# Patient Record
Sex: Female | Born: 1945
Health system: Southern US, Community
[De-identification: ages and names within clinical notes are randomized; demographics above are authoritative.]

## PROBLEM LIST (undated history)

## (undated) DIAGNOSIS — K8591 Acute pancreatitis with uninfected necrosis, unspecified: Secondary | ICD-10-CM

## (undated) DIAGNOSIS — E079 Disorder of thyroid, unspecified: Secondary | ICD-10-CM

## (undated) DIAGNOSIS — Z86718 Personal history of other venous thrombosis and embolism: Secondary | ICD-10-CM

## (undated) DIAGNOSIS — E119 Type 2 diabetes mellitus without complications: Secondary | ICD-10-CM

## (undated) DIAGNOSIS — I10 Essential (primary) hypertension: Secondary | ICD-10-CM

## (undated) DIAGNOSIS — Z5189 Encounter for other specified aftercare: Secondary | ICD-10-CM

## (undated) HISTORY — DX: Encounter for other specified aftercare: Z51.89

## (undated) HISTORY — DX: Type 2 diabetes mellitus without complications: E11.9

## (undated) HISTORY — DX: Personal history of other venous thrombosis and embolism: Z86.718

## (undated) HISTORY — DX: Acute pancreatitis with uninfected necrosis, unspecified: K85.91

## (undated) HISTORY — DX: Essential (primary) hypertension: I10

## (undated) HISTORY — DX: Disorder of thyroid, unspecified: E07.9

---

## 2005-03-22 HISTORY — PX: APPENDECTOMY: SHX54

## 2012-03-22 DIAGNOSIS — Z86718 Personal history of other venous thrombosis and embolism: Secondary | ICD-10-CM

## 2012-03-22 DIAGNOSIS — K8591 Acute pancreatitis with uninfected necrosis, unspecified: Secondary | ICD-10-CM

## 2012-03-22 HISTORY — DX: Acute pancreatitis with uninfected necrosis, unspecified: K85.91

## 2012-03-22 HISTORY — DX: Personal history of other venous thrombosis and embolism: Z86.718

## 2013-02-22 DIAGNOSIS — E039 Hypothyroidism, unspecified: Secondary | ICD-10-CM | POA: Insufficient documentation

## 2013-06-09 DIAGNOSIS — K8689 Other specified diseases of pancreas: Secondary | ICD-10-CM | POA: Insufficient documentation

## 2013-06-20 HISTORY — PX: PANCREAS SURGERY: SHX731

## 2013-06-28 DIAGNOSIS — E43 Unspecified severe protein-calorie malnutrition: Secondary | ICD-10-CM | POA: Insufficient documentation

## 2013-06-30 DIAGNOSIS — K6819 Other retroperitoneal abscess: Secondary | ICD-10-CM | POA: Insufficient documentation

## 2013-07-03 DIAGNOSIS — Z86711 Personal history of pulmonary embolism: Secondary | ICD-10-CM | POA: Insufficient documentation

## 2013-10-08 DIAGNOSIS — F411 Generalized anxiety disorder: Secondary | ICD-10-CM | POA: Insufficient documentation

## 2013-10-08 DIAGNOSIS — E119 Type 2 diabetes mellitus without complications: Secondary | ICD-10-CM | POA: Insufficient documentation

## 2013-10-08 DIAGNOSIS — D649 Anemia, unspecified: Secondary | ICD-10-CM | POA: Insufficient documentation

## 2013-10-08 DIAGNOSIS — L89309 Pressure ulcer of unspecified buttock, unspecified stage: Secondary | ICD-10-CM | POA: Insufficient documentation

## 2014-01-07 DIAGNOSIS — R600 Localized edema: Secondary | ICD-10-CM | POA: Insufficient documentation

## 2014-01-23 DIAGNOSIS — I899 Noninfective disorder of lymphatic vessels and lymph nodes, unspecified: Secondary | ICD-10-CM | POA: Insufficient documentation

## 2014-05-08 DIAGNOSIS — K8681 Exocrine pancreatic insufficiency: Secondary | ICD-10-CM | POA: Insufficient documentation

## 2014-08-07 DIAGNOSIS — K859 Acute pancreatitis without necrosis or infection, unspecified: Secondary | ICD-10-CM | POA: Insufficient documentation

## 2014-11-01 ENCOUNTER — Other Ambulatory Visit: Payer: Self-pay | Admitting: Family Medicine

## 2014-11-01 DIAGNOSIS — E2839 Other primary ovarian failure: Secondary | ICD-10-CM

## 2014-11-01 DIAGNOSIS — Z1231 Encounter for screening mammogram for malignant neoplasm of breast: Secondary | ICD-10-CM

## 2014-11-07 ENCOUNTER — Encounter: Payer: Self-pay | Admitting: Gastroenterology

## 2014-11-11 ENCOUNTER — Ambulatory Visit
Admission: RE | Admit: 2014-11-11 | Discharge: 2014-11-11 | Disposition: A | Discharge status: Home / Self Care | Payer: Medicare PPO | Source: Ambulatory Visit | Attending: Family Medicine | Admitting: Family Medicine

## 2014-11-11 DIAGNOSIS — Z1231 Encounter for screening mammogram for malignant neoplasm of breast: Secondary | ICD-10-CM

## 2014-11-11 DIAGNOSIS — E2839 Other primary ovarian failure: Secondary | ICD-10-CM

## 2014-11-22 DIAGNOSIS — R197 Diarrhea, unspecified: Secondary | ICD-10-CM | POA: Insufficient documentation

## 2014-11-22 DIAGNOSIS — M8580 Other specified disorders of bone density and structure, unspecified site: Secondary | ICD-10-CM | POA: Insufficient documentation

## 2015-01-14 ENCOUNTER — Encounter: Payer: Self-pay | Admitting: Gastroenterology

## 2015-03-03 ENCOUNTER — Encounter: Payer: Medicare PPO | Admitting: Gastroenterology

## 2015-05-12 DIAGNOSIS — E119 Type 2 diabetes mellitus without complications: Secondary | ICD-10-CM | POA: Diagnosis not present

## 2015-05-12 DIAGNOSIS — K8689 Other specified diseases of pancreas: Secondary | ICD-10-CM | POA: Diagnosis not present

## 2015-05-12 DIAGNOSIS — D638 Anemia in other chronic diseases classified elsewhere: Secondary | ICD-10-CM | POA: Diagnosis not present

## 2015-05-12 DIAGNOSIS — K8591 Acute pancreatitis with uninfected necrosis, unspecified: Secondary | ICD-10-CM | POA: Diagnosis not present

## 2015-05-12 DIAGNOSIS — K6819 Other retroperitoneal abscess: Secondary | ICD-10-CM | POA: Diagnosis not present

## 2015-05-12 DIAGNOSIS — E039 Hypothyroidism, unspecified: Secondary | ICD-10-CM | POA: Diagnosis not present

## 2015-05-16 DIAGNOSIS — Z794 Long term (current) use of insulin: Secondary | ICD-10-CM | POA: Diagnosis not present

## 2015-05-16 DIAGNOSIS — E119 Type 2 diabetes mellitus without complications: Secondary | ICD-10-CM | POA: Diagnosis not present

## 2015-05-16 DIAGNOSIS — E663 Overweight: Secondary | ICD-10-CM | POA: Diagnosis not present

## 2015-05-16 DIAGNOSIS — Z6829 Body mass index (BMI) 29.0-29.9, adult: Secondary | ICD-10-CM | POA: Diagnosis not present

## 2015-05-16 DIAGNOSIS — E039 Hypothyroidism, unspecified: Secondary | ICD-10-CM | POA: Diagnosis not present

## 2015-05-26 DIAGNOSIS — E039 Hypothyroidism, unspecified: Secondary | ICD-10-CM | POA: Diagnosis not present

## 2015-05-26 DIAGNOSIS — E559 Vitamin D deficiency, unspecified: Secondary | ICD-10-CM | POA: Diagnosis not present

## 2015-05-26 DIAGNOSIS — Z0000 Encounter for general adult medical examination without abnormal findings: Secondary | ICD-10-CM | POA: Diagnosis not present

## 2015-05-28 DIAGNOSIS — E119 Type 2 diabetes mellitus without complications: Secondary | ICD-10-CM | POA: Diagnosis not present

## 2015-05-28 DIAGNOSIS — E118 Type 2 diabetes mellitus with unspecified complications: Secondary | ICD-10-CM | POA: Diagnosis not present

## 2015-05-28 DIAGNOSIS — E039 Hypothyroidism, unspecified: Secondary | ICD-10-CM | POA: Diagnosis not present

## 2015-05-28 DIAGNOSIS — E559 Vitamin D deficiency, unspecified: Secondary | ICD-10-CM | POA: Diagnosis not present

## 2015-06-25 DIAGNOSIS — Z794 Long term (current) use of insulin: Secondary | ICD-10-CM | POA: Diagnosis not present

## 2015-06-25 DIAGNOSIS — E119 Type 2 diabetes mellitus without complications: Secondary | ICD-10-CM | POA: Diagnosis not present

## 2015-06-25 DIAGNOSIS — H2513 Age-related nuclear cataract, bilateral: Secondary | ICD-10-CM | POA: Diagnosis not present

## 2015-06-27 DIAGNOSIS — E119 Type 2 diabetes mellitus without complications: Secondary | ICD-10-CM | POA: Diagnosis not present

## 2015-08-28 DIAGNOSIS — E039 Hypothyroidism, unspecified: Secondary | ICD-10-CM | POA: Diagnosis not present

## 2015-08-28 DIAGNOSIS — E119 Type 2 diabetes mellitus without complications: Secondary | ICD-10-CM | POA: Diagnosis not present

## 2015-08-29 DIAGNOSIS — E039 Hypothyroidism, unspecified: Secondary | ICD-10-CM | POA: Diagnosis not present

## 2015-08-29 DIAGNOSIS — D649 Anemia, unspecified: Secondary | ICD-10-CM | POA: Diagnosis not present

## 2015-08-29 DIAGNOSIS — E118 Type 2 diabetes mellitus with unspecified complications: Secondary | ICD-10-CM | POA: Diagnosis not present

## 2015-11-13 ENCOUNTER — Other Ambulatory Visit: Payer: Self-pay | Admitting: Family Medicine

## 2015-11-13 DIAGNOSIS — Z1231 Encounter for screening mammogram for malignant neoplasm of breast: Secondary | ICD-10-CM

## 2015-11-18 ENCOUNTER — Ambulatory Visit
Admission: RE | Admit: 2015-11-18 | Discharge: 2015-11-18 | Disposition: A | Discharge status: Home / Self Care | Payer: Medicare PPO | Source: Ambulatory Visit | Attending: Family Medicine | Admitting: Family Medicine

## 2015-11-18 DIAGNOSIS — Z1231 Encounter for screening mammogram for malignant neoplasm of breast: Secondary | ICD-10-CM

## 2015-12-09 DIAGNOSIS — D485 Neoplasm of uncertain behavior of skin: Secondary | ICD-10-CM | POA: Diagnosis not present

## 2015-12-09 DIAGNOSIS — D1801 Hemangioma of skin and subcutaneous tissue: Secondary | ICD-10-CM | POA: Diagnosis not present

## 2015-12-09 DIAGNOSIS — L821 Other seborrheic keratosis: Secondary | ICD-10-CM | POA: Diagnosis not present

## 2015-12-09 DIAGNOSIS — D234 Other benign neoplasm of skin of scalp and neck: Secondary | ICD-10-CM | POA: Diagnosis not present

## 2015-12-09 DIAGNOSIS — Z08 Encounter for follow-up examination after completed treatment for malignant neoplasm: Secondary | ICD-10-CM | POA: Diagnosis not present

## 2015-12-09 DIAGNOSIS — Z85828 Personal history of other malignant neoplasm of skin: Secondary | ICD-10-CM | POA: Diagnosis not present

## 2015-12-09 DIAGNOSIS — Z872 Personal history of diseases of the skin and subcutaneous tissue: Secondary | ICD-10-CM | POA: Diagnosis not present

## 2015-12-10 DIAGNOSIS — E119 Type 2 diabetes mellitus without complications: Secondary | ICD-10-CM | POA: Diagnosis not present

## 2015-12-10 DIAGNOSIS — E118 Type 2 diabetes mellitus with unspecified complications: Secondary | ICD-10-CM | POA: Diagnosis not present

## 2015-12-12 DIAGNOSIS — F411 Generalized anxiety disorder: Secondary | ICD-10-CM | POA: Diagnosis not present

## 2015-12-12 DIAGNOSIS — E039 Hypothyroidism, unspecified: Secondary | ICD-10-CM | POA: Diagnosis not present

## 2015-12-12 DIAGNOSIS — Z23 Encounter for immunization: Secondary | ICD-10-CM | POA: Diagnosis not present

## 2015-12-12 DIAGNOSIS — E119 Type 2 diabetes mellitus without complications: Secondary | ICD-10-CM | POA: Diagnosis not present

## 2015-12-12 DIAGNOSIS — E559 Vitamin D deficiency, unspecified: Secondary | ICD-10-CM | POA: Diagnosis not present

## 2016-03-17 DIAGNOSIS — E039 Hypothyroidism, unspecified: Secondary | ICD-10-CM | POA: Diagnosis not present

## 2016-03-18 DIAGNOSIS — Z6829 Body mass index (BMI) 29.0-29.9, adult: Secondary | ICD-10-CM | POA: Diagnosis not present

## 2016-03-18 DIAGNOSIS — E118 Type 2 diabetes mellitus with unspecified complications: Secondary | ICD-10-CM | POA: Diagnosis not present

## 2016-03-18 DIAGNOSIS — E119 Type 2 diabetes mellitus without complications: Secondary | ICD-10-CM | POA: Diagnosis not present

## 2016-03-18 DIAGNOSIS — E039 Hypothyroidism, unspecified: Secondary | ICD-10-CM | POA: Diagnosis not present

## 2016-03-18 DIAGNOSIS — Z0000 Encounter for general adult medical examination without abnormal findings: Secondary | ICD-10-CM | POA: Diagnosis not present

## 2016-03-18 DIAGNOSIS — F411 Generalized anxiety disorder: Secondary | ICD-10-CM | POA: Diagnosis not present

## 2016-06-29 DIAGNOSIS — E119 Type 2 diabetes mellitus without complications: Secondary | ICD-10-CM | POA: Diagnosis not present

## 2016-06-29 DIAGNOSIS — E039 Hypothyroidism, unspecified: Secondary | ICD-10-CM | POA: Diagnosis not present

## 2016-07-01 DIAGNOSIS — I10 Essential (primary) hypertension: Secondary | ICD-10-CM | POA: Diagnosis not present

## 2016-07-01 DIAGNOSIS — E039 Hypothyroidism, unspecified: Secondary | ICD-10-CM | POA: Diagnosis not present

## 2016-07-01 DIAGNOSIS — E119 Type 2 diabetes mellitus without complications: Secondary | ICD-10-CM | POA: Diagnosis not present

## 2016-08-12 DIAGNOSIS — L820 Inflamed seborrheic keratosis: Secondary | ICD-10-CM | POA: Diagnosis not present

## 2016-08-12 DIAGNOSIS — D485 Neoplasm of uncertain behavior of skin: Secondary | ICD-10-CM | POA: Diagnosis not present

## 2016-08-12 DIAGNOSIS — L821 Other seborrheic keratosis: Secondary | ICD-10-CM | POA: Diagnosis not present

## 2016-08-30 DIAGNOSIS — E039 Hypothyroidism, unspecified: Secondary | ICD-10-CM | POA: Diagnosis not present

## 2016-09-01 DIAGNOSIS — H2513 Age-related nuclear cataract, bilateral: Secondary | ICD-10-CM | POA: Diagnosis not present

## 2016-09-01 DIAGNOSIS — E083291 Diabetes mellitus due to underlying condition with mild nonproliferative diabetic retinopathy without macular edema, right eye: Secondary | ICD-10-CM | POA: Diagnosis not present

## 2016-09-03 DIAGNOSIS — E039 Hypothyroidism, unspecified: Secondary | ICD-10-CM | POA: Diagnosis not present

## 2016-09-03 DIAGNOSIS — I10 Essential (primary) hypertension: Secondary | ICD-10-CM | POA: Diagnosis not present

## 2016-09-03 DIAGNOSIS — Z6830 Body mass index (BMI) 30.0-30.9, adult: Secondary | ICD-10-CM | POA: Diagnosis not present

## 2016-09-03 DIAGNOSIS — E663 Overweight: Secondary | ICD-10-CM | POA: Diagnosis not present

## 2016-10-20 ENCOUNTER — Other Ambulatory Visit: Payer: Self-pay | Admitting: Family Medicine

## 2016-10-20 DIAGNOSIS — Z1231 Encounter for screening mammogram for malignant neoplasm of breast: Secondary | ICD-10-CM

## 2016-10-29 DIAGNOSIS — E039 Hypothyroidism, unspecified: Secondary | ICD-10-CM | POA: Diagnosis not present

## 2016-10-29 DIAGNOSIS — E559 Vitamin D deficiency, unspecified: Secondary | ICD-10-CM | POA: Diagnosis not present

## 2016-10-29 DIAGNOSIS — M8580 Other specified disorders of bone density and structure, unspecified site: Secondary | ICD-10-CM | POA: Diagnosis not present

## 2016-10-29 DIAGNOSIS — E118 Type 2 diabetes mellitus with unspecified complications: Secondary | ICD-10-CM | POA: Diagnosis not present

## 2016-11-01 DIAGNOSIS — E119 Type 2 diabetes mellitus without complications: Secondary | ICD-10-CM | POA: Diagnosis not present

## 2016-11-01 DIAGNOSIS — E663 Overweight: Secondary | ICD-10-CM | POA: Diagnosis not present

## 2016-11-01 DIAGNOSIS — E559 Vitamin D deficiency, unspecified: Secondary | ICD-10-CM | POA: Diagnosis not present

## 2016-11-01 DIAGNOSIS — E039 Hypothyroidism, unspecified: Secondary | ICD-10-CM | POA: Diagnosis not present

## 2016-11-23 ENCOUNTER — Ambulatory Visit
Admission: RE | Admit: 2016-11-23 | Discharge: 2016-11-23 | Disposition: A | Discharge status: Home / Self Care | Payer: Medicare PPO | Source: Ambulatory Visit | Attending: Family Medicine | Admitting: Family Medicine

## 2016-11-23 DIAGNOSIS — Z1231 Encounter for screening mammogram for malignant neoplasm of breast: Secondary | ICD-10-CM

## 2016-12-02 DIAGNOSIS — Z23 Encounter for immunization: Secondary | ICD-10-CM | POA: Diagnosis not present

## 2016-12-07 DIAGNOSIS — B078 Other viral warts: Secondary | ICD-10-CM | POA: Diagnosis not present

## 2016-12-07 DIAGNOSIS — D1801 Hemangioma of skin and subcutaneous tissue: Secondary | ICD-10-CM | POA: Diagnosis not present

## 2016-12-07 DIAGNOSIS — Z85828 Personal history of other malignant neoplasm of skin: Secondary | ICD-10-CM | POA: Diagnosis not present

## 2016-12-07 DIAGNOSIS — D485 Neoplasm of uncertain behavior of skin: Secondary | ICD-10-CM | POA: Diagnosis not present

## 2016-12-07 DIAGNOSIS — L821 Other seborrheic keratosis: Secondary | ICD-10-CM | POA: Diagnosis not present

## 2016-12-07 DIAGNOSIS — Z08 Encounter for follow-up examination after completed treatment for malignant neoplasm: Secondary | ICD-10-CM | POA: Diagnosis not present

## 2016-12-24 DIAGNOSIS — Z6829 Body mass index (BMI) 29.0-29.9, adult: Secondary | ICD-10-CM | POA: Diagnosis not present

## 2016-12-24 DIAGNOSIS — I10 Essential (primary) hypertension: Secondary | ICD-10-CM | POA: Diagnosis not present

## 2016-12-24 DIAGNOSIS — F5104 Psychophysiologic insomnia: Secondary | ICD-10-CM | POA: Diagnosis not present

## 2016-12-24 DIAGNOSIS — E119 Type 2 diabetes mellitus without complications: Secondary | ICD-10-CM | POA: Diagnosis not present

## 2017-01-28 DIAGNOSIS — E119 Type 2 diabetes mellitus without complications: Secondary | ICD-10-CM | POA: Diagnosis not present

## 2017-01-31 DIAGNOSIS — I10 Essential (primary) hypertension: Secondary | ICD-10-CM | POA: Diagnosis not present

## 2017-01-31 DIAGNOSIS — E119 Type 2 diabetes mellitus without complications: Secondary | ICD-10-CM | POA: Diagnosis not present

## 2017-01-31 DIAGNOSIS — G4700 Insomnia, unspecified: Secondary | ICD-10-CM | POA: Diagnosis not present

## 2017-01-31 DIAGNOSIS — Z6829 Body mass index (BMI) 29.0-29.9, adult: Secondary | ICD-10-CM | POA: Diagnosis not present

## 2017-04-15 DIAGNOSIS — Z0000 Encounter for general adult medical examination without abnormal findings: Secondary | ICD-10-CM | POA: Diagnosis not present

## 2017-04-15 DIAGNOSIS — Z23 Encounter for immunization: Secondary | ICD-10-CM | POA: Diagnosis not present

## 2017-04-15 DIAGNOSIS — E118 Type 2 diabetes mellitus with unspecified complications: Secondary | ICD-10-CM | POA: Diagnosis not present

## 2017-04-15 DIAGNOSIS — Z1211 Encounter for screening for malignant neoplasm of colon: Secondary | ICD-10-CM | POA: Diagnosis not present

## 2017-04-15 DIAGNOSIS — Z6829 Body mass index (BMI) 29.0-29.9, adult: Secondary | ICD-10-CM | POA: Diagnosis not present

## 2017-04-19 ENCOUNTER — Encounter: Payer: Self-pay | Admitting: Gastroenterology

## 2017-05-31 DIAGNOSIS — Z6829 Body mass index (BMI) 29.0-29.9, adult: Secondary | ICD-10-CM | POA: Diagnosis not present

## 2017-05-31 DIAGNOSIS — E119 Type 2 diabetes mellitus without complications: Secondary | ICD-10-CM | POA: Diagnosis not present

## 2017-05-31 DIAGNOSIS — Z23 Encounter for immunization: Secondary | ICD-10-CM | POA: Diagnosis not present

## 2017-05-31 DIAGNOSIS — I10 Essential (primary) hypertension: Secondary | ICD-10-CM | POA: Diagnosis not present

## 2017-06-14 ENCOUNTER — Encounter: Payer: Medicare PPO | Admitting: Gastroenterology

## 2017-07-04 DIAGNOSIS — E118 Type 2 diabetes mellitus with unspecified complications: Secondary | ICD-10-CM | POA: Diagnosis not present

## 2017-07-04 DIAGNOSIS — E663 Overweight: Secondary | ICD-10-CM | POA: Diagnosis not present

## 2017-07-04 DIAGNOSIS — Z6829 Body mass index (BMI) 29.0-29.9, adult: Secondary | ICD-10-CM | POA: Diagnosis not present

## 2017-07-04 DIAGNOSIS — I10 Essential (primary) hypertension: Secondary | ICD-10-CM | POA: Diagnosis not present

## 2017-07-11 ENCOUNTER — Encounter: Payer: Medicare PPO | Admitting: Gastroenterology

## 2017-07-27 ENCOUNTER — Other Ambulatory Visit: Payer: Self-pay

## 2017-07-27 ENCOUNTER — Ambulatory Visit (AMBULATORY_SURGERY_CENTER): Payer: Self-pay | Admitting: *Deleted

## 2017-07-27 VITALS — Ht 59.75 in | Wt 145.0 lb

## 2017-07-27 DIAGNOSIS — E119 Type 2 diabetes mellitus without complications: Secondary | ICD-10-CM | POA: Insufficient documentation

## 2017-07-27 DIAGNOSIS — Z1211 Encounter for screening for malignant neoplasm of colon: Secondary | ICD-10-CM

## 2017-07-27 DIAGNOSIS — Z136 Encounter for screening for cardiovascular disorders: Secondary | ICD-10-CM | POA: Insufficient documentation

## 2017-07-27 MED ORDER — NA SULFATE-K SULFATE-MG SULF 17.5-3.13-1.6 GM/177ML PO SOLN: ORAL | 0 refills | Status: DC

## 2017-07-27 NOTE — Progress Notes (Signed)
Daughter present in [place] today. Patient denies any allergies to eggs or soy. Patient denies any problems with anesthesia/sedation. Patient denies any oxygen use at home. Patient denies taking any diet/weight loss medications or blood thinners. EMMI education assisgned to patient on colonoscopy, this was explained and instructions given to patient.

## 2017-08-01 ENCOUNTER — Telehealth: Payer: Self-pay | Admitting: Gastroenterology

## 2017-08-01 NOTE — Telephone Encounter (Signed)
Okay she can reschedule at her convenience.

## 2017-08-03 ENCOUNTER — Encounter: Payer: Medicare PPO | Admitting: Gastroenterology

## 2017-08-08 DIAGNOSIS — E039 Hypothyroidism, unspecified: Secondary | ICD-10-CM | POA: Diagnosis not present

## 2017-08-08 DIAGNOSIS — E119 Type 2 diabetes mellitus without complications: Secondary | ICD-10-CM | POA: Diagnosis not present

## 2017-08-08 DIAGNOSIS — I10 Essential (primary) hypertension: Secondary | ICD-10-CM | POA: Diagnosis not present

## 2017-08-08 DIAGNOSIS — Z6829 Body mass index (BMI) 29.0-29.9, adult: Secondary | ICD-10-CM | POA: Diagnosis not present

## 2017-08-10 DIAGNOSIS — E083291 Diabetes mellitus due to underlying condition with mild nonproliferative diabetic retinopathy without macular edema, right eye: Secondary | ICD-10-CM | POA: Diagnosis not present

## 2017-08-10 DIAGNOSIS — H2513 Age-related nuclear cataract, bilateral: Secondary | ICD-10-CM | POA: Diagnosis not present

## 2017-08-10 DIAGNOSIS — E1339 Other specified diabetes mellitus with other diabetic ophthalmic complication: Secondary | ICD-10-CM | POA: Diagnosis not present

## 2017-09-08 DIAGNOSIS — E039 Hypothyroidism, unspecified: Secondary | ICD-10-CM | POA: Diagnosis not present

## 2017-09-08 DIAGNOSIS — E559 Vitamin D deficiency, unspecified: Secondary | ICD-10-CM | POA: Diagnosis not present

## 2017-09-08 DIAGNOSIS — E119 Type 2 diabetes mellitus without complications: Secondary | ICD-10-CM | POA: Diagnosis not present

## 2017-09-12 DIAGNOSIS — E559 Vitamin D deficiency, unspecified: Secondary | ICD-10-CM | POA: Diagnosis not present

## 2017-09-12 DIAGNOSIS — E039 Hypothyroidism, unspecified: Secondary | ICD-10-CM | POA: Diagnosis not present

## 2017-09-12 DIAGNOSIS — I10 Essential (primary) hypertension: Secondary | ICD-10-CM | POA: Diagnosis not present

## 2017-09-12 DIAGNOSIS — E119 Type 2 diabetes mellitus without complications: Secondary | ICD-10-CM | POA: Diagnosis not present

## 2017-09-12 DIAGNOSIS — Z13220 Encounter for screening for lipoid disorders: Secondary | ICD-10-CM | POA: Diagnosis not present

## 2017-09-23 ENCOUNTER — Encounter: Payer: Self-pay | Admitting: Gastroenterology

## 2017-10-11 ENCOUNTER — Encounter: Payer: Medicare HMO | Admitting: Gastroenterology

## 2017-10-24 ENCOUNTER — Other Ambulatory Visit: Payer: Self-pay | Admitting: Family Medicine

## 2017-10-24 DIAGNOSIS — Z1231 Encounter for screening mammogram for malignant neoplasm of breast: Secondary | ICD-10-CM

## 2017-11-09 ENCOUNTER — Encounter: Payer: Self-pay | Admitting: Gastroenterology

## 2017-11-24 ENCOUNTER — Ambulatory Visit: Payer: Medicare HMO

## 2017-11-24 ENCOUNTER — Encounter: Payer: Medicare HMO | Admitting: Gastroenterology

## 2017-11-28 ENCOUNTER — Ambulatory Visit
Admission: RE | Admit: 2017-11-28 | Discharge: 2017-11-28 | Disposition: A | Discharge status: Home / Self Care | Payer: Medicare HMO | Source: Ambulatory Visit | Attending: Family Medicine | Admitting: Family Medicine

## 2017-11-28 DIAGNOSIS — Z1231 Encounter for screening mammogram for malignant neoplasm of breast: Secondary | ICD-10-CM

## 2017-12-15 DIAGNOSIS — E118 Type 2 diabetes mellitus with unspecified complications: Secondary | ICD-10-CM | POA: Diagnosis not present

## 2017-12-15 DIAGNOSIS — D649 Anemia, unspecified: Secondary | ICD-10-CM | POA: Diagnosis not present

## 2017-12-15 DIAGNOSIS — E039 Hypothyroidism, unspecified: Secondary | ICD-10-CM | POA: Diagnosis not present

## 2017-12-15 DIAGNOSIS — I10 Essential (primary) hypertension: Secondary | ICD-10-CM | POA: Diagnosis not present

## 2017-12-19 DIAGNOSIS — E119 Type 2 diabetes mellitus without complications: Secondary | ICD-10-CM | POA: Diagnosis not present

## 2017-12-19 DIAGNOSIS — I10 Essential (primary) hypertension: Secondary | ICD-10-CM | POA: Diagnosis not present

## 2017-12-19 DIAGNOSIS — Z6829 Body mass index (BMI) 29.0-29.9, adult: Secondary | ICD-10-CM | POA: Diagnosis not present

## 2017-12-19 DIAGNOSIS — Z23 Encounter for immunization: Secondary | ICD-10-CM | POA: Diagnosis not present

## 2017-12-19 DIAGNOSIS — E039 Hypothyroidism, unspecified: Secondary | ICD-10-CM | POA: Diagnosis not present

## 2017-12-20 ENCOUNTER — Encounter: Payer: Self-pay | Admitting: Gastroenterology

## 2017-12-20 ENCOUNTER — Other Ambulatory Visit: Payer: Self-pay

## 2017-12-20 ENCOUNTER — Ambulatory Visit (AMBULATORY_SURGERY_CENTER): Payer: Self-pay

## 2017-12-20 VITALS — Ht 59.75 in | Wt 142.8 lb

## 2017-12-20 DIAGNOSIS — Z1211 Encounter for screening for malignant neoplasm of colon: Secondary | ICD-10-CM

## 2017-12-20 NOTE — Progress Notes (Signed)
No egg or soy allergy known to patient  No issues with past sedation with any surgeries  or procedures, no intubation problems  No diet pills per patient No home 02 use per patient  No blood thinners per patient  Pt denies issues with constipation  No A fib or A flutter  EMMI video sent to pt's e mail , pt declined    

## 2018-01-03 ENCOUNTER — Encounter: Payer: Medicare HMO | Admitting: Gastroenterology

## 2018-01-17 DIAGNOSIS — E119 Type 2 diabetes mellitus without complications: Secondary | ICD-10-CM | POA: Diagnosis not present

## 2018-01-17 DIAGNOSIS — E11649 Type 2 diabetes mellitus with hypoglycemia without coma: Secondary | ICD-10-CM | POA: Diagnosis not present

## 2018-02-20 DIAGNOSIS — Z6828 Body mass index (BMI) 28.0-28.9, adult: Secondary | ICD-10-CM | POA: Diagnosis not present

## 2018-02-20 DIAGNOSIS — E118 Type 2 diabetes mellitus with unspecified complications: Secondary | ICD-10-CM | POA: Diagnosis not present

## 2018-04-11 DIAGNOSIS — D225 Melanocytic nevi of trunk: Secondary | ICD-10-CM | POA: Diagnosis not present

## 2018-04-11 DIAGNOSIS — Z872 Personal history of diseases of the skin and subcutaneous tissue: Secondary | ICD-10-CM | POA: Diagnosis not present

## 2018-04-11 DIAGNOSIS — L821 Other seborrheic keratosis: Secondary | ICD-10-CM | POA: Diagnosis not present

## 2018-04-11 DIAGNOSIS — Z08 Encounter for follow-up examination after completed treatment for malignant neoplasm: Secondary | ICD-10-CM | POA: Diagnosis not present

## 2018-04-11 DIAGNOSIS — Z85828 Personal history of other malignant neoplasm of skin: Secondary | ICD-10-CM | POA: Diagnosis not present

## 2018-04-13 DIAGNOSIS — E118 Type 2 diabetes mellitus with unspecified complications: Secondary | ICD-10-CM | POA: Diagnosis not present

## 2018-04-13 DIAGNOSIS — I10 Essential (primary) hypertension: Secondary | ICD-10-CM | POA: Diagnosis not present

## 2018-04-13 DIAGNOSIS — E119 Type 2 diabetes mellitus without complications: Secondary | ICD-10-CM | POA: Diagnosis not present

## 2018-04-17 DIAGNOSIS — Z0000 Encounter for general adult medical examination without abnormal findings: Secondary | ICD-10-CM | POA: Diagnosis not present

## 2018-04-17 DIAGNOSIS — E119 Type 2 diabetes mellitus without complications: Secondary | ICD-10-CM | POA: Diagnosis not present

## 2018-04-18 ENCOUNTER — Other Ambulatory Visit: Payer: Self-pay | Admitting: Family Medicine

## 2018-04-18 DIAGNOSIS — M810 Age-related osteoporosis without current pathological fracture: Secondary | ICD-10-CM

## 2018-06-14 ENCOUNTER — Other Ambulatory Visit: Payer: Medicare HMO

## 2018-06-16 DIAGNOSIS — E118 Type 2 diabetes mellitus with unspecified complications: Secondary | ICD-10-CM | POA: Diagnosis not present

## 2018-06-23 DIAGNOSIS — E663 Overweight: Secondary | ICD-10-CM | POA: Diagnosis not present

## 2018-06-23 DIAGNOSIS — I10 Essential (primary) hypertension: Secondary | ICD-10-CM | POA: Diagnosis not present

## 2018-06-23 DIAGNOSIS — E118 Type 2 diabetes mellitus with unspecified complications: Secondary | ICD-10-CM | POA: Diagnosis not present

## 2018-06-23 DIAGNOSIS — Z719 Counseling, unspecified: Secondary | ICD-10-CM | POA: Diagnosis not present

## 2018-07-14 DIAGNOSIS — I10 Essential (primary) hypertension: Secondary | ICD-10-CM | POA: Diagnosis not present

## 2018-07-14 DIAGNOSIS — E118 Type 2 diabetes mellitus with unspecified complications: Secondary | ICD-10-CM | POA: Diagnosis not present

## 2018-07-14 DIAGNOSIS — Z719 Counseling, unspecified: Secondary | ICD-10-CM | POA: Diagnosis not present

## 2018-07-14 DIAGNOSIS — E663 Overweight: Secondary | ICD-10-CM | POA: Diagnosis not present

## 2018-08-17 DIAGNOSIS — E118 Type 2 diabetes mellitus with unspecified complications: Secondary | ICD-10-CM | POA: Diagnosis not present

## 2018-09-15 DIAGNOSIS — I10 Essential (primary) hypertension: Secondary | ICD-10-CM | POA: Diagnosis not present

## 2018-09-26 DIAGNOSIS — H2513 Age-related nuclear cataract, bilateral: Secondary | ICD-10-CM | POA: Diagnosis not present

## 2018-09-26 DIAGNOSIS — Z794 Long term (current) use of insulin: Secondary | ICD-10-CM | POA: Diagnosis not present

## 2018-09-26 DIAGNOSIS — E109 Type 1 diabetes mellitus without complications: Secondary | ICD-10-CM | POA: Diagnosis not present

## 2018-10-04 ENCOUNTER — Other Ambulatory Visit: Payer: Medicare HMO

## 2018-10-24 ENCOUNTER — Other Ambulatory Visit: Payer: Self-pay | Admitting: Family Medicine

## 2018-10-24 DIAGNOSIS — Z1231 Encounter for screening mammogram for malignant neoplasm of breast: Secondary | ICD-10-CM

## 2018-11-09 DIAGNOSIS — I10 Essential (primary) hypertension: Secondary | ICD-10-CM | POA: Diagnosis not present

## 2018-11-09 DIAGNOSIS — E118 Type 2 diabetes mellitus with unspecified complications: Secondary | ICD-10-CM | POA: Diagnosis not present

## 2018-11-09 DIAGNOSIS — E785 Hyperlipidemia, unspecified: Secondary | ICD-10-CM | POA: Diagnosis not present

## 2018-11-09 DIAGNOSIS — E039 Hypothyroidism, unspecified: Secondary | ICD-10-CM | POA: Diagnosis not present

## 2018-11-09 DIAGNOSIS — E109 Type 1 diabetes mellitus without complications: Secondary | ICD-10-CM | POA: Diagnosis not present

## 2018-11-16 DIAGNOSIS — E039 Hypothyroidism, unspecified: Secondary | ICD-10-CM | POA: Diagnosis not present

## 2018-11-16 DIAGNOSIS — E118 Type 2 diabetes mellitus with unspecified complications: Secondary | ICD-10-CM | POA: Diagnosis not present

## 2018-11-16 DIAGNOSIS — I10 Essential (primary) hypertension: Secondary | ICD-10-CM | POA: Diagnosis not present

## 2018-11-28 ENCOUNTER — Other Ambulatory Visit: Payer: Self-pay | Admitting: Pharmacist

## 2018-12-05 DIAGNOSIS — Z23 Encounter for immunization: Secondary | ICD-10-CM | POA: Diagnosis not present

## 2018-12-07 DIAGNOSIS — I10 Essential (primary) hypertension: Secondary | ICD-10-CM | POA: Diagnosis not present

## 2018-12-07 DIAGNOSIS — E118 Type 2 diabetes mellitus with unspecified complications: Secondary | ICD-10-CM | POA: Diagnosis not present

## 2018-12-07 DIAGNOSIS — E663 Overweight: Secondary | ICD-10-CM | POA: Diagnosis not present

## 2018-12-07 NOTE — Patient Outreach (Addendum)
[facility]) Care Management [place]  11/28/2018  Sara Daniels 04-Feb-1946 [id number]  Reason for referral: medication management  Successful call to Ms. Stigler with HIPAA identifiers verified.  Patient states she is trying to get a Dexcom meter, however she has been unsuccessful.  Unfortunately, patient does not meet Medicare guidelines for scanner glucometer.  She is not injecting insulin >/= to 3 times a day.  Will follow up with insurance to see claim denial.  Patient is aware that obtaining a scanner glucometer is unlikely.  PLAN:  I will follow up with patient next month  [name], PharmD, [place]  [phone]

## 2018-12-12 ENCOUNTER — Ambulatory Visit: Payer: Self-pay | Admitting: Pharmacist

## 2018-12-21 ENCOUNTER — Ambulatory Visit
Admission: RE | Admit: 2018-12-21 | Discharge: 2018-12-21 | Disposition: A | Discharge status: Home / Self Care | Payer: Medicare HMO | Source: Ambulatory Visit | Attending: Family Medicine | Admitting: Family Medicine

## 2018-12-21 ENCOUNTER — Other Ambulatory Visit: Payer: Self-pay

## 2018-12-21 DIAGNOSIS — Z780 Asymptomatic menopausal state: Secondary | ICD-10-CM | POA: Diagnosis not present

## 2018-12-21 DIAGNOSIS — Z1231 Encounter for screening mammogram for malignant neoplasm of breast: Secondary | ICD-10-CM

## 2018-12-21 DIAGNOSIS — M8589 Other specified disorders of bone density and structure, multiple sites: Secondary | ICD-10-CM | POA: Diagnosis not present

## 2018-12-21 DIAGNOSIS — M810 Age-related osteoporosis without current pathological fracture: Secondary | ICD-10-CM

## 2018-12-26 ENCOUNTER — Ambulatory Visit: Payer: Self-pay | Admitting: Pharmacist

## 2018-12-26 ENCOUNTER — Other Ambulatory Visit: Payer: Self-pay | Admitting: Pharmacist

## 2018-12-26 NOTE — Patient Outreach (Signed)
[facility] [place]) Care Management [place]  12/26/2018  Sara Daniels Jul 24, 1945 PI:[id number]  Reason for referral: medication assistance  [facility] pharmacy case is being closed due to the following reasons:  Benefits explored-->scanner glucometer not covered under member's plan   [name], PharmD, [place]  [phone]

## 2019-01-16 DIAGNOSIS — M8580 Other specified disorders of bone density and structure, unspecified site: Secondary | ICD-10-CM | POA: Diagnosis not present

## 2019-01-16 DIAGNOSIS — E118 Type 2 diabetes mellitus with unspecified complications: Secondary | ICD-10-CM | POA: Diagnosis not present

## 2019-01-16 DIAGNOSIS — E559 Vitamin D deficiency, unspecified: Secondary | ICD-10-CM | POA: Diagnosis not present

## 2019-03-26 DIAGNOSIS — E118 Type 2 diabetes mellitus with unspecified complications: Secondary | ICD-10-CM | POA: Diagnosis not present

## 2019-03-26 DIAGNOSIS — E559 Vitamin D deficiency, unspecified: Secondary | ICD-10-CM | POA: Diagnosis not present

## 2019-03-26 DIAGNOSIS — E039 Hypothyroidism, unspecified: Secondary | ICD-10-CM | POA: Diagnosis not present

## 2019-03-26 DIAGNOSIS — E785 Hyperlipidemia, unspecified: Secondary | ICD-10-CM | POA: Diagnosis not present

## 2019-03-26 DIAGNOSIS — I10 Essential (primary) hypertension: Secondary | ICD-10-CM | POA: Diagnosis not present

## 2019-03-26 DIAGNOSIS — E119 Type 2 diabetes mellitus without complications: Secondary | ICD-10-CM | POA: Diagnosis not present

## 2019-03-30 DIAGNOSIS — E039 Hypothyroidism, unspecified: Secondary | ICD-10-CM | POA: Diagnosis not present

## 2019-03-30 DIAGNOSIS — E118 Type 2 diabetes mellitus with unspecified complications: Secondary | ICD-10-CM | POA: Diagnosis not present

## 2019-03-30 DIAGNOSIS — E559 Vitamin D deficiency, unspecified: Secondary | ICD-10-CM | POA: Diagnosis not present

## 2019-03-30 DIAGNOSIS — I10 Essential (primary) hypertension: Secondary | ICD-10-CM | POA: Diagnosis not present

## 2019-04-19 DIAGNOSIS — E118 Type 2 diabetes mellitus with unspecified complications: Secondary | ICD-10-CM | POA: Diagnosis not present

## 2019-04-19 DIAGNOSIS — I10 Essential (primary) hypertension: Secondary | ICD-10-CM | POA: Diagnosis not present

## 2019-04-19 DIAGNOSIS — Z6829 Body mass index (BMI) 29.0-29.9, adult: Secondary | ICD-10-CM | POA: Diagnosis not present

## 2019-04-19 DIAGNOSIS — E663 Overweight: Secondary | ICD-10-CM | POA: Diagnosis not present

## 2019-04-19 DIAGNOSIS — Z0000 Encounter for general adult medical examination without abnormal findings: Secondary | ICD-10-CM | POA: Diagnosis not present

## 2019-05-24 DIAGNOSIS — Z1211 Encounter for screening for malignant neoplasm of colon: Secondary | ICD-10-CM | POA: Diagnosis not present

## 2019-06-19 DIAGNOSIS — L570 Actinic keratosis: Secondary | ICD-10-CM | POA: Diagnosis not present

## 2019-06-19 DIAGNOSIS — Z7189 Other specified counseling: Secondary | ICD-10-CM | POA: Diagnosis not present

## 2019-06-19 DIAGNOSIS — D225 Melanocytic nevi of trunk: Secondary | ICD-10-CM | POA: Diagnosis not present

## 2019-06-19 DIAGNOSIS — Z08 Encounter for follow-up examination after completed treatment for malignant neoplasm: Secondary | ICD-10-CM | POA: Diagnosis not present

## 2019-06-19 DIAGNOSIS — Z85828 Personal history of other malignant neoplasm of skin: Secondary | ICD-10-CM | POA: Diagnosis not present

## 2019-06-19 DIAGNOSIS — L821 Other seborrheic keratosis: Secondary | ICD-10-CM | POA: Diagnosis not present

## 2019-07-16 DIAGNOSIS — E039 Hypothyroidism, unspecified: Secondary | ICD-10-CM | POA: Diagnosis not present

## 2019-07-16 DIAGNOSIS — E785 Hyperlipidemia, unspecified: Secondary | ICD-10-CM | POA: Diagnosis not present

## 2019-07-16 DIAGNOSIS — E119 Type 2 diabetes mellitus without complications: Secondary | ICD-10-CM | POA: Diagnosis not present

## 2019-07-16 DIAGNOSIS — E559 Vitamin D deficiency, unspecified: Secondary | ICD-10-CM | POA: Diagnosis not present

## 2019-07-16 DIAGNOSIS — I10 Essential (primary) hypertension: Secondary | ICD-10-CM | POA: Diagnosis not present

## 2019-07-19 DIAGNOSIS — E559 Vitamin D deficiency, unspecified: Secondary | ICD-10-CM | POA: Diagnosis not present

## 2019-07-19 DIAGNOSIS — E039 Hypothyroidism, unspecified: Secondary | ICD-10-CM | POA: Diagnosis not present

## 2019-07-19 DIAGNOSIS — I10 Essential (primary) hypertension: Secondary | ICD-10-CM | POA: Diagnosis not present

## 2019-07-19 DIAGNOSIS — E118 Type 2 diabetes mellitus with unspecified complications: Secondary | ICD-10-CM | POA: Diagnosis not present

## 2019-09-18 DIAGNOSIS — E1165 Type 2 diabetes mellitus with hyperglycemia: Secondary | ICD-10-CM | POA: Diagnosis not present

## 2019-10-01 DIAGNOSIS — E663 Overweight: Secondary | ICD-10-CM | POA: Diagnosis not present

## 2019-10-01 DIAGNOSIS — E1165 Type 2 diabetes mellitus with hyperglycemia: Secondary | ICD-10-CM | POA: Diagnosis not present

## 2019-10-01 DIAGNOSIS — I10 Essential (primary) hypertension: Secondary | ICD-10-CM | POA: Diagnosis not present

## 2019-10-08 DIAGNOSIS — E1165 Type 2 diabetes mellitus with hyperglycemia: Secondary | ICD-10-CM | POA: Diagnosis not present

## 2019-10-12 DIAGNOSIS — I10 Essential (primary) hypertension: Secondary | ICD-10-CM | POA: Diagnosis not present

## 2019-10-12 DIAGNOSIS — E118 Type 2 diabetes mellitus with unspecified complications: Secondary | ICD-10-CM | POA: Diagnosis not present

## 2019-10-17 DIAGNOSIS — I152 Hypertension secondary to endocrine disorders: Secondary | ICD-10-CM | POA: Diagnosis not present

## 2019-10-17 DIAGNOSIS — E1122 Type 2 diabetes mellitus with diabetic chronic kidney disease: Secondary | ICD-10-CM | POA: Diagnosis not present

## 2019-10-17 DIAGNOSIS — I10 Essential (primary) hypertension: Secondary | ICD-10-CM | POA: Diagnosis not present

## 2019-10-17 DIAGNOSIS — E782 Mixed hyperlipidemia: Secondary | ICD-10-CM | POA: Diagnosis not present

## 2019-10-17 DIAGNOSIS — I129 Hypertensive chronic kidney disease with stage 1 through stage 4 chronic kidney disease, or unspecified chronic kidney disease: Secondary | ICD-10-CM | POA: Diagnosis not present

## 2019-10-17 DIAGNOSIS — E1165 Type 2 diabetes mellitus with hyperglycemia: Secondary | ICD-10-CM | POA: Diagnosis not present

## 2019-10-17 DIAGNOSIS — E1159 Type 2 diabetes mellitus with other circulatory complications: Secondary | ICD-10-CM | POA: Diagnosis not present

## 2019-10-30 DIAGNOSIS — E1165 Type 2 diabetes mellitus with hyperglycemia: Secondary | ICD-10-CM | POA: Diagnosis not present

## 2019-11-09 DIAGNOSIS — E1165 Type 2 diabetes mellitus with hyperglycemia: Secondary | ICD-10-CM | POA: Diagnosis not present

## 2019-11-09 DIAGNOSIS — Z789 Other specified health status: Secondary | ICD-10-CM | POA: Diagnosis not present

## 2019-11-22 DIAGNOSIS — E1165 Type 2 diabetes mellitus with hyperglycemia: Secondary | ICD-10-CM | POA: Diagnosis not present

## 2019-12-11 DIAGNOSIS — Z794 Long term (current) use of insulin: Secondary | ICD-10-CM | POA: Diagnosis not present

## 2019-12-11 DIAGNOSIS — E1339 Other specified diabetes mellitus with other diabetic ophthalmic complication: Secondary | ICD-10-CM | POA: Diagnosis not present

## 2019-12-11 DIAGNOSIS — H2513 Age-related nuclear cataract, bilateral: Secondary | ICD-10-CM | POA: Diagnosis not present

## 2019-12-11 DIAGNOSIS — E083291 Diabetes mellitus due to underlying condition with mild nonproliferative diabetic retinopathy without macular edema, right eye: Secondary | ICD-10-CM | POA: Diagnosis not present

## 2019-12-31 DIAGNOSIS — E1165 Type 2 diabetes mellitus with hyperglycemia: Secondary | ICD-10-CM | POA: Diagnosis not present

## 2020-01-01 DIAGNOSIS — E1165 Type 2 diabetes mellitus with hyperglycemia: Secondary | ICD-10-CM | POA: Diagnosis not present

## 2020-01-01 DIAGNOSIS — Z23 Encounter for immunization: Secondary | ICD-10-CM | POA: Diagnosis not present

## 2020-01-08 DIAGNOSIS — E1165 Type 2 diabetes mellitus with hyperglycemia: Secondary | ICD-10-CM | POA: Diagnosis not present

## 2020-01-10 DIAGNOSIS — E1169 Type 2 diabetes mellitus with other specified complication: Secondary | ICD-10-CM | POA: Diagnosis not present

## 2020-01-10 DIAGNOSIS — E782 Mixed hyperlipidemia: Secondary | ICD-10-CM | POA: Diagnosis not present

## 2020-01-10 DIAGNOSIS — E559 Vitamin D deficiency, unspecified: Secondary | ICD-10-CM | POA: Diagnosis not present

## 2020-01-14 DIAGNOSIS — I10 Essential (primary) hypertension: Secondary | ICD-10-CM | POA: Diagnosis not present

## 2020-01-14 DIAGNOSIS — E1165 Type 2 diabetes mellitus with hyperglycemia: Secondary | ICD-10-CM | POA: Diagnosis not present

## 2020-01-14 DIAGNOSIS — E782 Mixed hyperlipidemia: Secondary | ICD-10-CM | POA: Diagnosis not present

## 2020-01-14 DIAGNOSIS — E559 Vitamin D deficiency, unspecified: Secondary | ICD-10-CM | POA: Diagnosis not present

## 2020-01-15 DIAGNOSIS — E1165 Type 2 diabetes mellitus with hyperglycemia: Secondary | ICD-10-CM | POA: Diagnosis not present

## 2020-01-23 DIAGNOSIS — E119 Type 2 diabetes mellitus without complications: Secondary | ICD-10-CM | POA: Diagnosis not present

## 2020-02-23 DIAGNOSIS — E119 Type 2 diabetes mellitus without complications: Secondary | ICD-10-CM | POA: Diagnosis not present

## 2020-02-25 DIAGNOSIS — Z7185 Encounter for immunization safety counseling: Secondary | ICD-10-CM | POA: Diagnosis not present

## 2020-02-25 DIAGNOSIS — E663 Overweight: Secondary | ICD-10-CM | POA: Diagnosis not present

## 2020-02-25 DIAGNOSIS — E1165 Type 2 diabetes mellitus with hyperglycemia: Secondary | ICD-10-CM | POA: Diagnosis not present

## 2020-02-25 DIAGNOSIS — I10 Essential (primary) hypertension: Secondary | ICD-10-CM | POA: Diagnosis not present

## 2020-03-25 DIAGNOSIS — E119 Type 2 diabetes mellitus without complications: Secondary | ICD-10-CM | POA: Diagnosis not present

## 2020-04-09 DIAGNOSIS — I10 Essential (primary) hypertension: Secondary | ICD-10-CM | POA: Diagnosis not present

## 2020-04-09 DIAGNOSIS — E782 Mixed hyperlipidemia: Secondary | ICD-10-CM | POA: Diagnosis not present

## 2020-04-09 DIAGNOSIS — E1165 Type 2 diabetes mellitus with hyperglycemia: Secondary | ICD-10-CM | POA: Diagnosis not present

## 2020-04-14 DIAGNOSIS — I10 Essential (primary) hypertension: Secondary | ICD-10-CM | POA: Diagnosis not present

## 2020-04-14 DIAGNOSIS — E1165 Type 2 diabetes mellitus with hyperglycemia: Secondary | ICD-10-CM | POA: Diagnosis not present

## 2020-04-14 DIAGNOSIS — E039 Hypothyroidism, unspecified: Secondary | ICD-10-CM | POA: Diagnosis not present

## 2020-04-23 DIAGNOSIS — Z0000 Encounter for general adult medical examination without abnormal findings: Secondary | ICD-10-CM | POA: Diagnosis not present

## 2020-04-23 DIAGNOSIS — Z1331 Encounter for screening for depression: Secondary | ICD-10-CM | POA: Diagnosis not present

## 2020-04-23 DIAGNOSIS — Z1339 Encounter for screening examination for other mental health and behavioral disorders: Secondary | ICD-10-CM | POA: Diagnosis not present

## 2020-04-25 DIAGNOSIS — E119 Type 2 diabetes mellitus without complications: Secondary | ICD-10-CM | POA: Diagnosis not present

## 2020-05-15 DIAGNOSIS — M8580 Other specified disorders of bone density and structure, unspecified site: Secondary | ICD-10-CM | POA: Diagnosis not present

## 2020-05-15 DIAGNOSIS — E1165 Type 2 diabetes mellitus with hyperglycemia: Secondary | ICD-10-CM | POA: Diagnosis not present

## 2020-05-15 DIAGNOSIS — I10 Essential (primary) hypertension: Secondary | ICD-10-CM | POA: Diagnosis not present

## 2020-05-26 DIAGNOSIS — E119 Type 2 diabetes mellitus without complications: Secondary | ICD-10-CM | POA: Diagnosis not present

## 2020-06-17 DIAGNOSIS — Z85828 Personal history of other malignant neoplasm of skin: Secondary | ICD-10-CM | POA: Diagnosis not present

## 2020-06-17 DIAGNOSIS — Z08 Encounter for follow-up examination after completed treatment for malignant neoplasm: Secondary | ICD-10-CM | POA: Diagnosis not present

## 2020-06-17 DIAGNOSIS — L821 Other seborrheic keratosis: Secondary | ICD-10-CM | POA: Diagnosis not present

## 2020-06-17 DIAGNOSIS — Z7189 Other specified counseling: Secondary | ICD-10-CM | POA: Diagnosis not present

## 2020-06-17 DIAGNOSIS — Z872 Personal history of diseases of the skin and subcutaneous tissue: Secondary | ICD-10-CM | POA: Diagnosis not present

## 2020-06-17 DIAGNOSIS — D225 Melanocytic nevi of trunk: Secondary | ICD-10-CM | POA: Diagnosis not present

## 2020-06-26 DIAGNOSIS — E119 Type 2 diabetes mellitus without complications: Secondary | ICD-10-CM | POA: Diagnosis not present

## 2020-07-27 DIAGNOSIS — E119 Type 2 diabetes mellitus without complications: Secondary | ICD-10-CM | POA: Diagnosis not present

## 2020-07-31 DIAGNOSIS — E1165 Type 2 diabetes mellitus with hyperglycemia: Secondary | ICD-10-CM | POA: Diagnosis not present

## 2020-07-31 DIAGNOSIS — R739 Hyperglycemia, unspecified: Secondary | ICD-10-CM | POA: Diagnosis not present

## 2020-07-31 DIAGNOSIS — I10 Essential (primary) hypertension: Secondary | ICD-10-CM | POA: Diagnosis not present

## 2020-07-31 DIAGNOSIS — Z7185 Encounter for immunization safety counseling: Secondary | ICD-10-CM | POA: Diagnosis not present

## 2020-07-31 DIAGNOSIS — E782 Mixed hyperlipidemia: Secondary | ICD-10-CM | POA: Diagnosis not present

## 2020-08-27 DIAGNOSIS — E119 Type 2 diabetes mellitus without complications: Secondary | ICD-10-CM | POA: Diagnosis not present

## 2020-09-09 DIAGNOSIS — Z23 Encounter for immunization: Secondary | ICD-10-CM | POA: Diagnosis not present

## 2020-09-09 DIAGNOSIS — H00022 Hordeolum internum right lower eyelid: Secondary | ICD-10-CM | POA: Diagnosis not present

## 2020-09-09 DIAGNOSIS — H1013 Acute atopic conjunctivitis, bilateral: Secondary | ICD-10-CM | POA: Diagnosis not present

## 2020-09-09 DIAGNOSIS — E039 Hypothyroidism, unspecified: Secondary | ICD-10-CM | POA: Diagnosis not present

## 2020-09-09 DIAGNOSIS — Z7185 Encounter for immunization safety counseling: Secondary | ICD-10-CM | POA: Diagnosis not present

## 2020-09-11 DIAGNOSIS — I10 Essential (primary) hypertension: Secondary | ICD-10-CM | POA: Diagnosis not present

## 2020-09-11 DIAGNOSIS — E039 Hypothyroidism, unspecified: Secondary | ICD-10-CM | POA: Diagnosis not present

## 2020-09-27 DIAGNOSIS — E119 Type 2 diabetes mellitus without complications: Secondary | ICD-10-CM | POA: Diagnosis not present

## 2020-10-02 DIAGNOSIS — H05012 Cellulitis of left orbit: Secondary | ICD-10-CM | POA: Diagnosis not present

## 2020-10-28 DIAGNOSIS — E119 Type 2 diabetes mellitus without complications: Secondary | ICD-10-CM | POA: Diagnosis not present

## 2020-11-11 DIAGNOSIS — E1165 Type 2 diabetes mellitus with hyperglycemia: Secondary | ICD-10-CM | POA: Diagnosis not present

## 2020-11-13 DIAGNOSIS — E1165 Type 2 diabetes mellitus with hyperglycemia: Secondary | ICD-10-CM | POA: Diagnosis not present

## 2020-11-13 DIAGNOSIS — G4700 Insomnia, unspecified: Secondary | ICD-10-CM | POA: Diagnosis not present

## 2020-11-13 DIAGNOSIS — I10 Essential (primary) hypertension: Secondary | ICD-10-CM | POA: Diagnosis not present

## 2020-11-27 DIAGNOSIS — Z23 Encounter for immunization: Secondary | ICD-10-CM | POA: Diagnosis not present

## 2020-11-28 DIAGNOSIS — E119 Type 2 diabetes mellitus without complications: Secondary | ICD-10-CM | POA: Diagnosis not present

## 2020-12-29 DIAGNOSIS — E119 Type 2 diabetes mellitus without complications: Secondary | ICD-10-CM | POA: Diagnosis not present

## 2021-01-29 DIAGNOSIS — E119 Type 2 diabetes mellitus without complications: Secondary | ICD-10-CM | POA: Diagnosis not present

## 2021-02-19 DIAGNOSIS — E1165 Type 2 diabetes mellitus with hyperglycemia: Secondary | ICD-10-CM | POA: Diagnosis not present

## 2021-02-19 DIAGNOSIS — Z23 Encounter for immunization: Secondary | ICD-10-CM | POA: Diagnosis not present

## 2021-02-19 DIAGNOSIS — I152 Hypertension secondary to endocrine disorders: Secondary | ICD-10-CM | POA: Diagnosis not present

## 2021-02-19 DIAGNOSIS — Z7185 Encounter for immunization safety counseling: Secondary | ICD-10-CM | POA: Diagnosis not present

## 2021-02-19 DIAGNOSIS — E782 Mixed hyperlipidemia: Secondary | ICD-10-CM | POA: Diagnosis not present

## 2021-02-19 DIAGNOSIS — R739 Hyperglycemia, unspecified: Secondary | ICD-10-CM | POA: Diagnosis not present

## 2021-02-19 DIAGNOSIS — I10 Essential (primary) hypertension: Secondary | ICD-10-CM | POA: Diagnosis not present

## 2021-02-19 DIAGNOSIS — E1159 Type 2 diabetes mellitus with other circulatory complications: Secondary | ICD-10-CM | POA: Diagnosis not present

## 2021-03-01 DIAGNOSIS — E119 Type 2 diabetes mellitus without complications: Secondary | ICD-10-CM | POA: Diagnosis not present

## 2021-04-01 DIAGNOSIS — E559 Vitamin D deficiency, unspecified: Secondary | ICD-10-CM | POA: Diagnosis not present

## 2021-04-01 DIAGNOSIS — I152 Hypertension secondary to endocrine disorders: Secondary | ICD-10-CM | POA: Diagnosis not present

## 2021-04-01 DIAGNOSIS — E119 Type 2 diabetes mellitus without complications: Secondary | ICD-10-CM | POA: Diagnosis not present

## 2021-04-01 DIAGNOSIS — I10 Essential (primary) hypertension: Secondary | ICD-10-CM | POA: Diagnosis not present

## 2021-04-01 DIAGNOSIS — E1165 Type 2 diabetes mellitus with hyperglycemia: Secondary | ICD-10-CM | POA: Diagnosis not present

## 2021-04-01 DIAGNOSIS — E1159 Type 2 diabetes mellitus with other circulatory complications: Secondary | ICD-10-CM | POA: Diagnosis not present

## 2021-05-02 DIAGNOSIS — E119 Type 2 diabetes mellitus without complications: Secondary | ICD-10-CM | POA: Diagnosis not present

## 2021-05-05 DIAGNOSIS — H2513 Age-related nuclear cataract, bilateral: Secondary | ICD-10-CM | POA: Diagnosis not present

## 2021-05-05 DIAGNOSIS — H01009 Unspecified blepharitis unspecified eye, unspecified eyelid: Secondary | ICD-10-CM | POA: Diagnosis not present

## 2021-05-05 DIAGNOSIS — Z794 Long term (current) use of insulin: Secondary | ICD-10-CM | POA: Diagnosis not present

## 2021-05-05 DIAGNOSIS — E109 Type 1 diabetes mellitus without complications: Secondary | ICD-10-CM | POA: Diagnosis not present

## 2021-05-12 DIAGNOSIS — Z1339 Encounter for screening examination for other mental health and behavioral disorders: Secondary | ICD-10-CM | POA: Diagnosis not present

## 2021-05-12 DIAGNOSIS — I10 Essential (primary) hypertension: Secondary | ICD-10-CM | POA: Diagnosis not present

## 2021-05-12 DIAGNOSIS — Z1331 Encounter for screening for depression: Secondary | ICD-10-CM | POA: Diagnosis not present

## 2021-05-12 DIAGNOSIS — E1165 Type 2 diabetes mellitus with hyperglycemia: Secondary | ICD-10-CM | POA: Diagnosis not present

## 2021-05-12 DIAGNOSIS — Z0000 Encounter for general adult medical examination without abnormal findings: Secondary | ICD-10-CM | POA: Diagnosis not present

## 2021-05-20 DIAGNOSIS — E782 Mixed hyperlipidemia: Secondary | ICD-10-CM | POA: Diagnosis not present

## 2021-05-20 DIAGNOSIS — E119 Type 2 diabetes mellitus without complications: Secondary | ICD-10-CM | POA: Diagnosis not present

## 2021-05-20 DIAGNOSIS — Z23 Encounter for immunization: Secondary | ICD-10-CM | POA: Diagnosis not present

## 2021-05-20 DIAGNOSIS — E559 Vitamin D deficiency, unspecified: Secondary | ICD-10-CM | POA: Diagnosis not present

## 2021-05-20 DIAGNOSIS — I10 Essential (primary) hypertension: Secondary | ICD-10-CM | POA: Diagnosis not present

## 2021-05-25 ENCOUNTER — Other Ambulatory Visit: Payer: Self-pay | Admitting: Family Medicine

## 2021-05-25 DIAGNOSIS — E039 Hypothyroidism, unspecified: Secondary | ICD-10-CM | POA: Diagnosis not present

## 2021-05-25 DIAGNOSIS — E1122 Type 2 diabetes mellitus with diabetic chronic kidney disease: Secondary | ICD-10-CM | POA: Diagnosis not present

## 2021-05-25 DIAGNOSIS — M8580 Other specified disorders of bone density and structure, unspecified site: Secondary | ICD-10-CM

## 2021-05-25 DIAGNOSIS — I10 Essential (primary) hypertension: Secondary | ICD-10-CM | POA: Diagnosis not present

## 2021-05-25 DIAGNOSIS — E2839 Other primary ovarian failure: Secondary | ICD-10-CM

## 2021-05-25 DIAGNOSIS — E559 Vitamin D deficiency, unspecified: Secondary | ICD-10-CM | POA: Diagnosis not present

## 2021-05-25 DIAGNOSIS — E1165 Type 2 diabetes mellitus with hyperglycemia: Secondary | ICD-10-CM | POA: Diagnosis not present

## 2021-05-25 DIAGNOSIS — E782 Mixed hyperlipidemia: Secondary | ICD-10-CM | POA: Diagnosis not present

## 2021-05-25 DIAGNOSIS — I129 Hypertensive chronic kidney disease with stage 1 through stage 4 chronic kidney disease, or unspecified chronic kidney disease: Secondary | ICD-10-CM | POA: Diagnosis not present

## 2021-06-02 DIAGNOSIS — E119 Type 2 diabetes mellitus without complications: Secondary | ICD-10-CM | POA: Diagnosis not present

## 2021-06-29 DIAGNOSIS — Z08 Encounter for follow-up examination after completed treatment for malignant neoplasm: Secondary | ICD-10-CM | POA: Diagnosis not present

## 2021-06-29 DIAGNOSIS — L821 Other seborrheic keratosis: Secondary | ICD-10-CM | POA: Diagnosis not present

## 2021-06-29 DIAGNOSIS — Z872 Personal history of diseases of the skin and subcutaneous tissue: Secondary | ICD-10-CM | POA: Diagnosis not present

## 2021-06-29 DIAGNOSIS — Z7189 Other specified counseling: Secondary | ICD-10-CM | POA: Diagnosis not present

## 2021-06-29 DIAGNOSIS — L814 Other melanin hyperpigmentation: Secondary | ICD-10-CM | POA: Diagnosis not present

## 2021-06-29 DIAGNOSIS — Z85828 Personal history of other malignant neoplasm of skin: Secondary | ICD-10-CM | POA: Diagnosis not present

## 2021-06-29 DIAGNOSIS — D225 Melanocytic nevi of trunk: Secondary | ICD-10-CM | POA: Diagnosis not present

## 2021-07-03 DIAGNOSIS — E119 Type 2 diabetes mellitus without complications: Secondary | ICD-10-CM | POA: Diagnosis not present

## 2021-08-03 DIAGNOSIS — E119 Type 2 diabetes mellitus without complications: Secondary | ICD-10-CM | POA: Diagnosis not present

## 2021-08-19 DIAGNOSIS — E119 Type 2 diabetes mellitus without complications: Secondary | ICD-10-CM | POA: Diagnosis not present

## 2021-08-19 DIAGNOSIS — I10 Essential (primary) hypertension: Secondary | ICD-10-CM | POA: Diagnosis not present

## 2021-08-24 DIAGNOSIS — I129 Hypertensive chronic kidney disease with stage 1 through stage 4 chronic kidney disease, or unspecified chronic kidney disease: Secondary | ICD-10-CM | POA: Diagnosis not present

## 2021-08-24 DIAGNOSIS — E1122 Type 2 diabetes mellitus with diabetic chronic kidney disease: Secondary | ICD-10-CM | POA: Diagnosis not present

## 2021-08-24 DIAGNOSIS — E1165 Type 2 diabetes mellitus with hyperglycemia: Secondary | ICD-10-CM | POA: Diagnosis not present

## 2021-08-24 DIAGNOSIS — E1121 Type 2 diabetes mellitus with diabetic nephropathy: Secondary | ICD-10-CM | POA: Diagnosis not present

## 2021-08-24 DIAGNOSIS — E663 Overweight: Secondary | ICD-10-CM | POA: Diagnosis not present

## 2021-08-24 DIAGNOSIS — I10 Essential (primary) hypertension: Secondary | ICD-10-CM | POA: Diagnosis not present

## 2021-09-03 DIAGNOSIS — E119 Type 2 diabetes mellitus without complications: Secondary | ICD-10-CM | POA: Diagnosis not present

## 2021-09-15 IMAGING — MG MM DIGITAL SCREENING BILAT W/ TOMO W/ CAD
8 series · 8 of 24 positions shown · non-contrast
Comparison: Previous exam(s).

CLINICAL DATA: Screening.

EXAM:
DIGITAL SCREENING BILATERAL MAMMOGRAM WITH TOMO AND CAD

[R CC synth-2D]
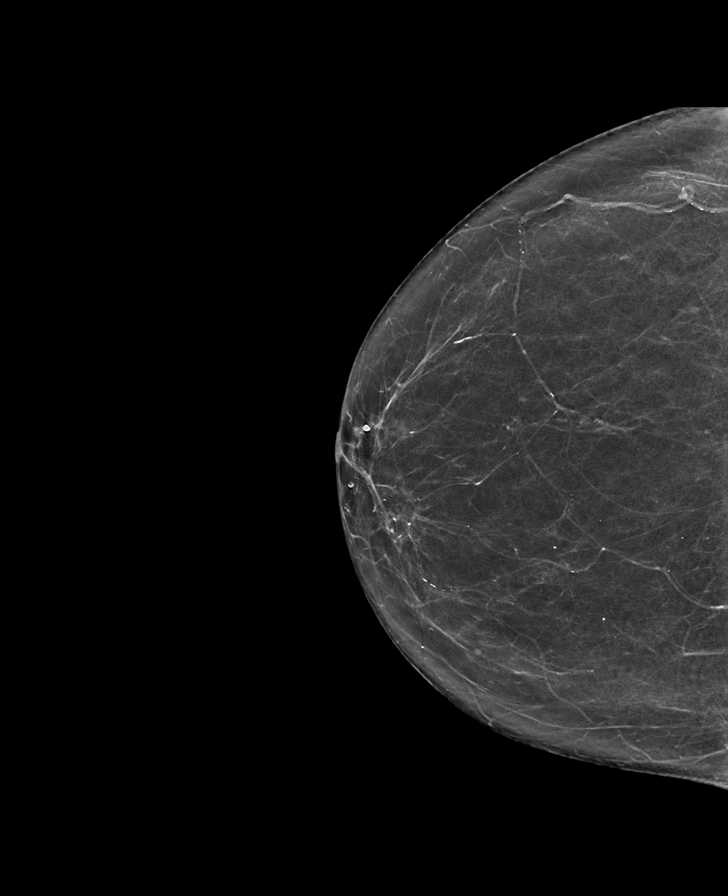

[L CC synth-2D]
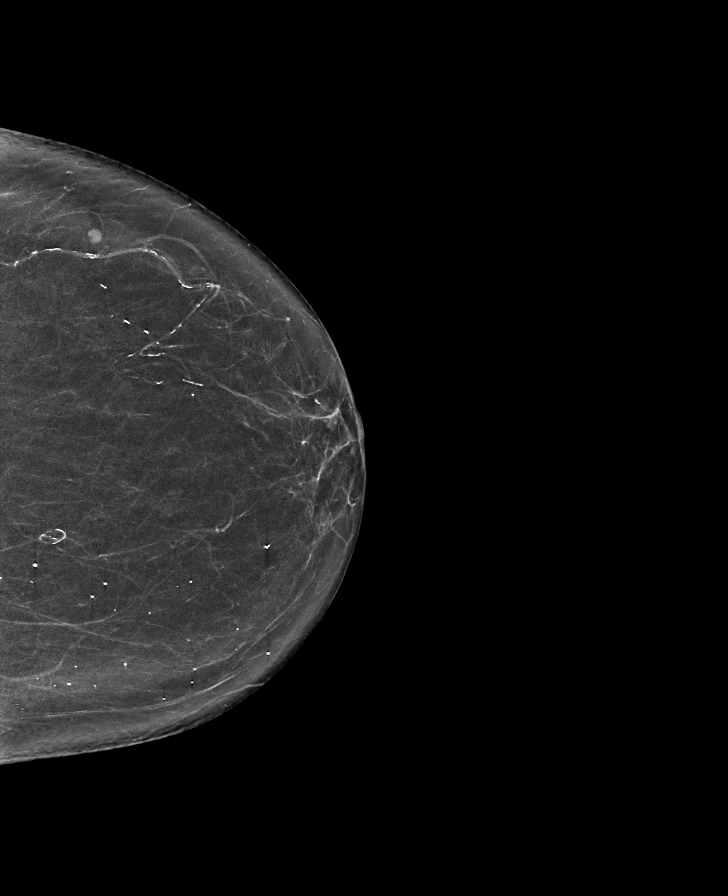

[L MLO synth-2D]
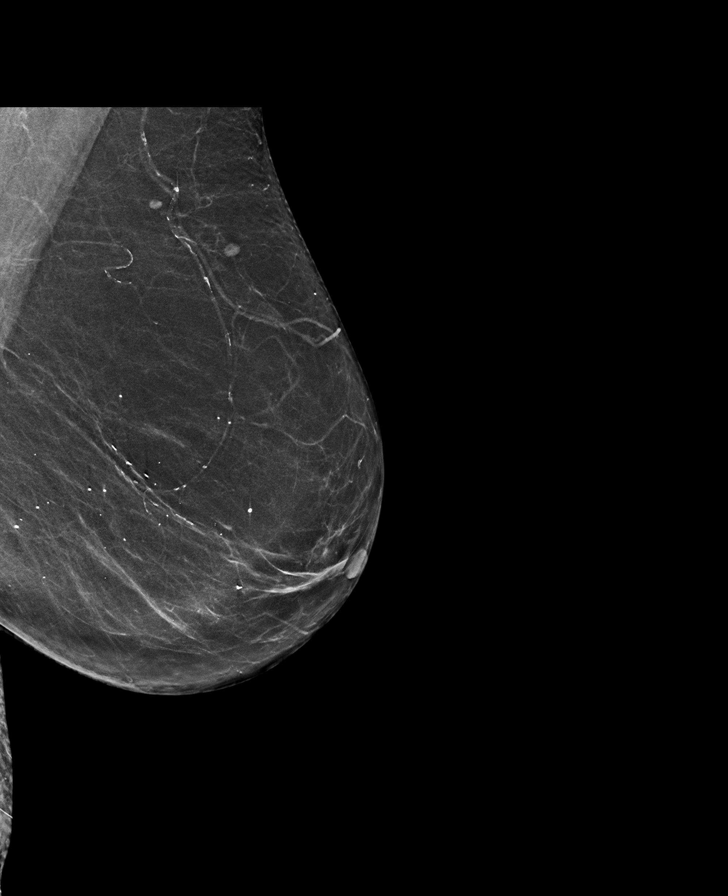

[R MLO synth-2D]
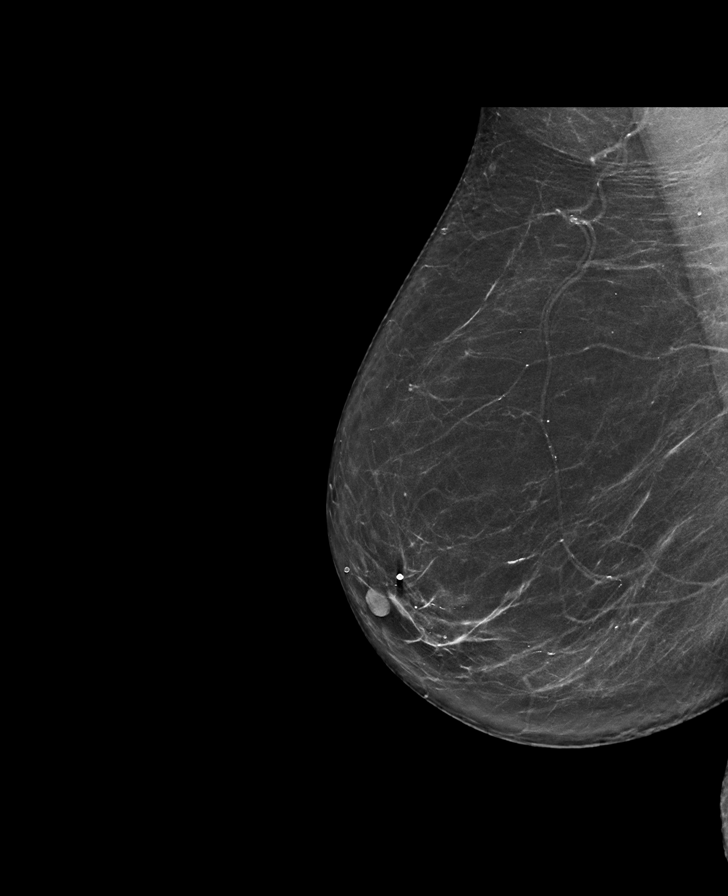

[L CC tomo · tomo slice 33/64.0]
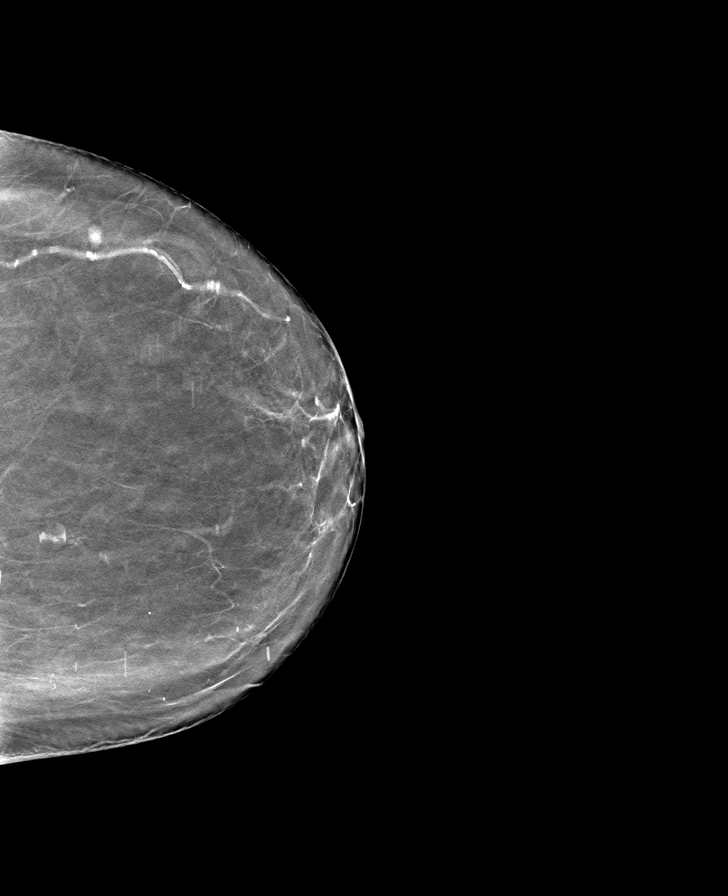

[R CC tomo · tomo slice 33/64.0]
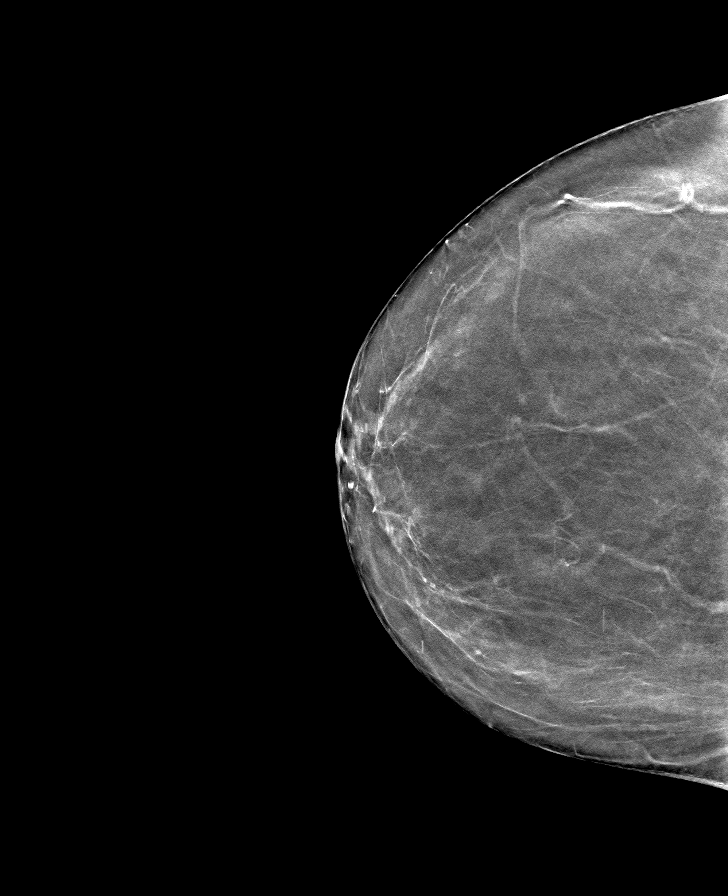

[L MLO tomo · tomo slice 37/72.0]
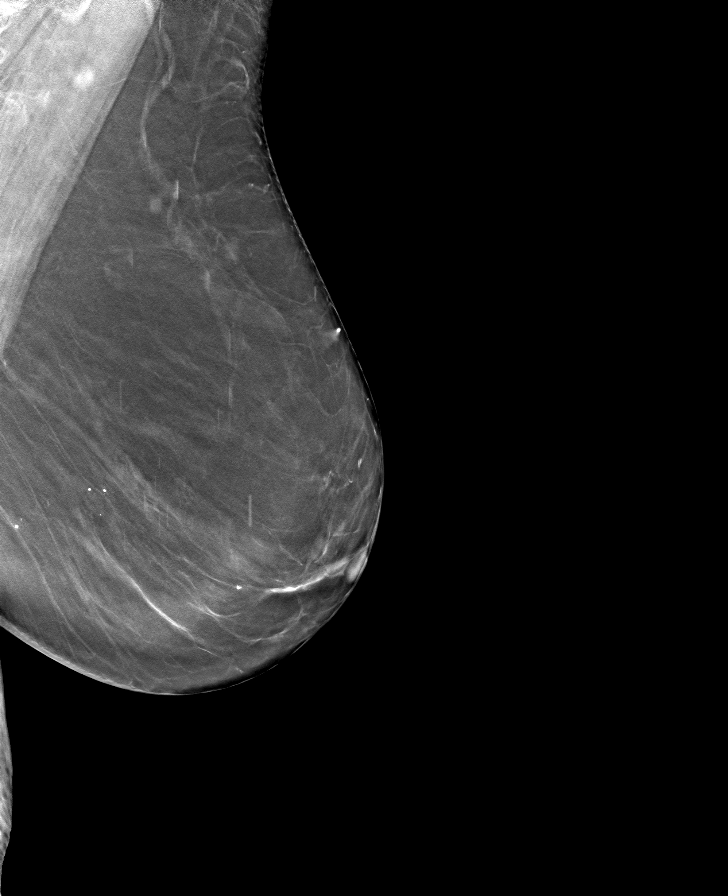

[R MLO tomo · tomo slice 37/72.0]
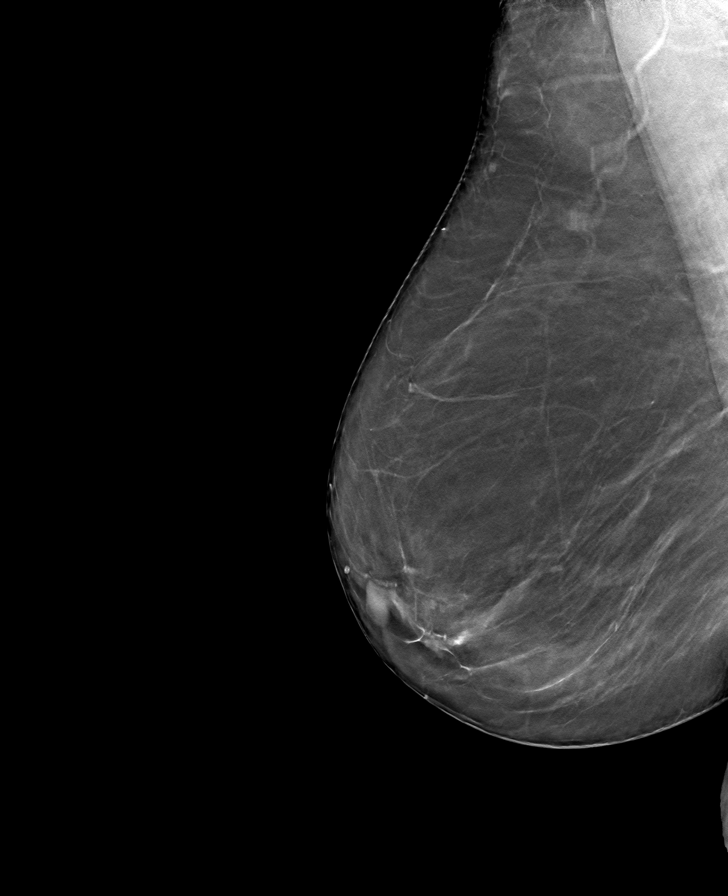

[8 of 24 positions shown; findings below may reference images not displayed]

ACR Breast Density Category b: There are scattered areas of
fibroglandular density.
FINDINGS: There are no findings suspicious for malignancy. Images were
processed with CAD.
IMPRESSION: No mammographic evidence of malignancy. A result letter of this
screening mammogram will be mailed directly to the patient.

RECOMMENDATION:
Screening mammogram in one year. (Code:[id number])

BI-RADS CATEGORY  1: Negative.

## 2021-10-04 DIAGNOSIS — E119 Type 2 diabetes mellitus without complications: Secondary | ICD-10-CM | POA: Diagnosis not present

## 2021-11-04 DIAGNOSIS — E119 Type 2 diabetes mellitus without complications: Secondary | ICD-10-CM | POA: Diagnosis not present

## 2021-11-06 ENCOUNTER — Ambulatory Visit
Admission: RE | Admit: 2021-11-06 | Discharge: 2021-11-06 | Disposition: A | Discharge status: Home / Self Care | Payer: Medicare HMO | Source: Ambulatory Visit | Attending: Family Medicine | Admitting: Family Medicine

## 2021-11-06 DIAGNOSIS — M8589 Other specified disorders of bone density and structure, multiple sites: Secondary | ICD-10-CM | POA: Diagnosis not present

## 2021-11-06 DIAGNOSIS — E2839 Other primary ovarian failure: Secondary | ICD-10-CM

## 2021-11-06 DIAGNOSIS — M8580 Other specified disorders of bone density and structure, unspecified site: Secondary | ICD-10-CM

## 2021-11-06 DIAGNOSIS — Z780 Asymptomatic menopausal state: Secondary | ICD-10-CM | POA: Diagnosis not present

## 2021-11-17 DIAGNOSIS — E559 Vitamin D deficiency, unspecified: Secondary | ICD-10-CM | POA: Diagnosis not present

## 2021-11-17 DIAGNOSIS — E039 Hypothyroidism, unspecified: Secondary | ICD-10-CM | POA: Diagnosis not present

## 2021-11-17 DIAGNOSIS — E1165 Type 2 diabetes mellitus with hyperglycemia: Secondary | ICD-10-CM | POA: Diagnosis not present

## 2021-11-25 DIAGNOSIS — M8580 Other specified disorders of bone density and structure, unspecified site: Secondary | ICD-10-CM | POA: Diagnosis not present

## 2021-11-25 DIAGNOSIS — E1143 Type 2 diabetes mellitus with diabetic autonomic (poly)neuropathy: Secondary | ICD-10-CM | POA: Diagnosis not present

## 2021-11-25 DIAGNOSIS — E1165 Type 2 diabetes mellitus with hyperglycemia: Secondary | ICD-10-CM | POA: Diagnosis not present

## 2021-11-25 DIAGNOSIS — E039 Hypothyroidism, unspecified: Secondary | ICD-10-CM | POA: Diagnosis not present

## 2021-11-25 DIAGNOSIS — E559 Vitamin D deficiency, unspecified: Secondary | ICD-10-CM | POA: Diagnosis not present

## 2021-12-05 DIAGNOSIS — E119 Type 2 diabetes mellitus without complications: Secondary | ICD-10-CM | POA: Diagnosis not present

## 2021-12-14 DIAGNOSIS — Z23 Encounter for immunization: Secondary | ICD-10-CM | POA: Diagnosis not present

## 2022-01-05 DIAGNOSIS — E119 Type 2 diabetes mellitus without complications: Secondary | ICD-10-CM | POA: Diagnosis not present

## 2022-02-22 DIAGNOSIS — E1165 Type 2 diabetes mellitus with hyperglycemia: Secondary | ICD-10-CM | POA: Diagnosis not present

## 2022-02-22 DIAGNOSIS — I10 Essential (primary) hypertension: Secondary | ICD-10-CM | POA: Diagnosis not present

## 2022-02-25 DIAGNOSIS — E1121 Type 2 diabetes mellitus with diabetic nephropathy: Secondary | ICD-10-CM | POA: Diagnosis not present

## 2022-02-25 DIAGNOSIS — I129 Hypertensive chronic kidney disease with stage 1 through stage 4 chronic kidney disease, or unspecified chronic kidney disease: Secondary | ICD-10-CM | POA: Diagnosis not present

## 2022-02-25 DIAGNOSIS — E1122 Type 2 diabetes mellitus with diabetic chronic kidney disease: Secondary | ICD-10-CM | POA: Diagnosis not present

## 2022-02-25 DIAGNOSIS — E1165 Type 2 diabetes mellitus with hyperglycemia: Secondary | ICD-10-CM | POA: Diagnosis not present

## 2022-02-25 DIAGNOSIS — E039 Hypothyroidism, unspecified: Secondary | ICD-10-CM | POA: Diagnosis not present

## 2022-02-25 DIAGNOSIS — I10 Essential (primary) hypertension: Secondary | ICD-10-CM | POA: Diagnosis not present

## 2022-03-02 DIAGNOSIS — E119 Type 2 diabetes mellitus without complications: Secondary | ICD-10-CM | POA: Diagnosis not present

## 2022-03-19 DIAGNOSIS — U071 COVID-19: Secondary | ICD-10-CM | POA: Diagnosis not present

## 2022-03-19 DIAGNOSIS — J069 Acute upper respiratory infection, unspecified: Secondary | ICD-10-CM | POA: Diagnosis not present

## 2022-04-01 DIAGNOSIS — E119 Type 2 diabetes mellitus without complications: Secondary | ICD-10-CM | POA: Diagnosis not present

## 2022-04-08 DIAGNOSIS — E039 Hypothyroidism, unspecified: Secondary | ICD-10-CM | POA: Diagnosis not present

## 2022-04-08 DIAGNOSIS — Z13220 Encounter for screening for lipoid disorders: Secondary | ICD-10-CM | POA: Diagnosis not present

## 2022-04-08 DIAGNOSIS — R7301 Impaired fasting glucose: Secondary | ICD-10-CM | POA: Diagnosis not present

## 2022-04-08 DIAGNOSIS — Z114 Encounter for screening for human immunodeficiency virus [HIV]: Secondary | ICD-10-CM | POA: Diagnosis not present

## 2022-04-08 DIAGNOSIS — Z0000 Encounter for general adult medical examination without abnormal findings: Secondary | ICD-10-CM | POA: Diagnosis not present

## 2022-04-16 DIAGNOSIS — Z0000 Encounter for general adult medical examination without abnormal findings: Secondary | ICD-10-CM | POA: Diagnosis not present

## 2022-04-16 DIAGNOSIS — Z23 Encounter for immunization: Secondary | ICD-10-CM | POA: Diagnosis not present

## 2022-05-02 DIAGNOSIS — E119 Type 2 diabetes mellitus without complications: Secondary | ICD-10-CM | POA: Diagnosis not present

## 2022-05-08 ENCOUNTER — Inpatient Hospital Stay (HOSPITAL_BASED_OUTPATIENT_CLINIC_OR_DEPARTMENT_OTHER)
Admission: EM | Admit: 2022-05-08 | Discharge: 2022-05-11 | DRG: 389 | Disposition: A | Discharge status: Home / Self Care | Payer: Medicare HMO | Attending: Internal Medicine | Admitting: Internal Medicine

## 2022-05-08 ENCOUNTER — Emergency Department (HOSPITAL_BASED_OUTPATIENT_CLINIC_OR_DEPARTMENT_OTHER): Payer: Medicare HMO

## 2022-05-08 ENCOUNTER — Other Ambulatory Visit: Payer: Self-pay

## 2022-05-08 ENCOUNTER — Encounter (HOSPITAL_BASED_OUTPATIENT_CLINIC_OR_DEPARTMENT_OTHER): Payer: Self-pay | Admitting: Emergency Medicine

## 2022-05-08 DIAGNOSIS — E039 Hypothyroidism, unspecified: Secondary | ICD-10-CM | POA: Diagnosis present

## 2022-05-08 DIAGNOSIS — I82890 Acute embolism and thrombosis of other specified veins: Secondary | ICD-10-CM | POA: Diagnosis present

## 2022-05-08 DIAGNOSIS — K8020 Calculus of gallbladder without cholecystitis without obstruction: Secondary | ICD-10-CM | POA: Diagnosis present

## 2022-05-08 DIAGNOSIS — E119 Type 2 diabetes mellitus without complications: Secondary | ICD-10-CM | POA: Diagnosis not present

## 2022-05-08 DIAGNOSIS — N3000 Acute cystitis without hematuria: Secondary | ICD-10-CM

## 2022-05-08 DIAGNOSIS — K861 Other chronic pancreatitis: Secondary | ICD-10-CM | POA: Diagnosis present

## 2022-05-08 DIAGNOSIS — Z86718 Personal history of other venous thrombosis and embolism: Secondary | ICD-10-CM

## 2022-05-08 DIAGNOSIS — K863 Pseudocyst of pancreas: Secondary | ICD-10-CM | POA: Diagnosis present

## 2022-05-08 DIAGNOSIS — Z4682 Encounter for fitting and adjustment of non-vascular catheter: Secondary | ICD-10-CM | POA: Diagnosis not present

## 2022-05-08 DIAGNOSIS — K56609 Unspecified intestinal obstruction, unspecified as to partial versus complete obstruction: Secondary | ICD-10-CM | POA: Diagnosis not present

## 2022-05-08 DIAGNOSIS — N390 Urinary tract infection, site not specified: Secondary | ICD-10-CM | POA: Diagnosis present

## 2022-05-08 DIAGNOSIS — I10 Essential (primary) hypertension: Secondary | ICD-10-CM | POA: Diagnosis present

## 2022-05-08 DIAGNOSIS — R1084 Generalized abdominal pain: Secondary | ICD-10-CM | POA: Diagnosis not present

## 2022-05-08 DIAGNOSIS — R9431 Abnormal electrocardiogram [ECG] [EKG]: Secondary | ICD-10-CM | POA: Diagnosis not present

## 2022-05-08 DIAGNOSIS — Z7985 Long-term (current) use of injectable non-insulin antidiabetic drugs: Secondary | ICD-10-CM

## 2022-05-08 DIAGNOSIS — K56600 Partial intestinal obstruction, unspecified as to cause: Principal | ICD-10-CM | POA: Diagnosis present

## 2022-05-08 DIAGNOSIS — R1110 Vomiting, unspecified: Secondary | ICD-10-CM | POA: Diagnosis not present

## 2022-05-08 DIAGNOSIS — B9689 Other specified bacterial agents as the cause of diseases classified elsewhere: Secondary | ICD-10-CM | POA: Diagnosis present

## 2022-05-08 DIAGNOSIS — Z1152 Encounter for screening for COVID-19: Secondary | ICD-10-CM

## 2022-05-08 DIAGNOSIS — B961 Klebsiella pneumoniae [K. pneumoniae] as the cause of diseases classified elsewhere: Secondary | ICD-10-CM | POA: Diagnosis present

## 2022-05-08 DIAGNOSIS — Z8674 Personal history of sudden cardiac arrest: Secondary | ICD-10-CM | POA: Diagnosis not present

## 2022-05-08 DIAGNOSIS — Z800 Family history of malignant neoplasm of digestive organs: Secondary | ICD-10-CM

## 2022-05-08 DIAGNOSIS — N179 Acute kidney failure, unspecified: Secondary | ICD-10-CM | POA: Diagnosis present

## 2022-05-08 DIAGNOSIS — I864 Gastric varices: Secondary | ICD-10-CM | POA: Diagnosis present

## 2022-05-08 DIAGNOSIS — Z8041 Family history of malignant neoplasm of ovary: Secondary | ICD-10-CM | POA: Diagnosis not present

## 2022-05-08 DIAGNOSIS — E876 Hypokalemia: Secondary | ICD-10-CM | POA: Diagnosis present

## 2022-05-08 DIAGNOSIS — Z794 Long term (current) use of insulin: Secondary | ICD-10-CM | POA: Diagnosis not present

## 2022-05-08 DIAGNOSIS — K5730 Diverticulosis of large intestine without perforation or abscess without bleeding: Secondary | ICD-10-CM | POA: Diagnosis not present

## 2022-05-08 DIAGNOSIS — K5669 Other partial intestinal obstruction: Secondary | ICD-10-CM | POA: Diagnosis not present

## 2022-05-08 DIAGNOSIS — E11649 Type 2 diabetes mellitus with hypoglycemia without coma: Secondary | ICD-10-CM | POA: Diagnosis not present

## 2022-05-08 DIAGNOSIS — K6389 Other specified diseases of intestine: Secondary | ICD-10-CM | POA: Diagnosis not present

## 2022-05-08 LAB — CBC
HCT: 42.2 % (ref 36.0–46.0)
Hemoglobin: 14.6 g/dL (ref 12.0–15.0)
MCH: 31.1 pg (ref 26.0–34.0)
MCHC: 34.6 g/dL (ref 30.0–36.0)
MCV: 90 fL (ref 80.0–100.0)
Platelets: 213 10*3/uL (ref 150–400)
RBC: 4.69 MIL/uL (ref 3.87–5.11)
RDW: 13 % (ref 11.5–15.5)
WBC: 10.5 10*3/uL (ref 4.0–10.5)
nRBC: 0 % (ref 0.0–0.2)

## 2022-05-08 LAB — COMPREHENSIVE METABOLIC PANEL
ALT: 25 U/L (ref 0–44)
AST: 23 U/L (ref 15–41)
Albumin: 5 g/dL (ref 3.5–5.0)
Alkaline Phosphatase: 67 U/L (ref 38–126)
Anion gap: 13 (ref 5–15)
BUN: 38 mg/dL — ABNORMAL HIGH (ref 8–23)
CO2: 25 mmol/L (ref 22–32)
Calcium: 10.3 mg/dL (ref 8.9–10.3)
Chloride: 97 mmol/L — ABNORMAL LOW (ref 98–111)
Creatinine, Ser: 1.51 mg/dL — ABNORMAL HIGH (ref 0.44–1.00)
GFR, Estimated: 35 mL/min — ABNORMAL LOW (ref >60)
Glucose, Bld: 191 mg/dL — ABNORMAL HIGH (ref 70–99)
Potassium: 4.3 mmol/L (ref 3.5–5.1)
Sodium: 135 mmol/L (ref 135–145)
Total Bilirubin: 1.2 mg/dL (ref 0.3–1.2)
Total Protein: 8 g/dL (ref 6.5–8.1)

## 2022-05-08 LAB — URINALYSIS, W/ REFLEX TO CULTURE (INFECTION SUSPECTED)
Bilirubin Urine: NEGATIVE
Glucose, UA: NEGATIVE mg/dL
Hgb urine dipstick: NEGATIVE
Ketones, ur: NEGATIVE mg/dL
Nitrite: NEGATIVE
Specific Gravity, Urine: >1.046 — ABNORMAL HIGH (ref 1.005–1.030)
pH: 6.5 (ref 5.0–8.0)

## 2022-05-08 LAB — URINALYSIS, ROUTINE W REFLEX MICROSCOPIC
Bilirubin Urine: NEGATIVE
Glucose, UA: NEGATIVE mg/dL
Hgb urine dipstick: NEGATIVE
Ketones, ur: NEGATIVE mg/dL
Nitrite: NEGATIVE
Specific Gravity, Urine: 1.02 (ref 1.005–1.030)
WBC, UA: >50 WBC/hpf (ref 0–5)
pH: 5 (ref 5.0–8.0)

## 2022-05-08 LAB — LIPASE, BLOOD: Lipase: 13 U/L (ref 11–51)

## 2022-05-08 LAB — CBG MONITORING, ED: Glucose-Capillary: 120 mg/dL — ABNORMAL HIGH (ref 70–99)

## 2022-05-08 LAB — GLUCOSE, CAPILLARY: Glucose-Capillary: 91 mg/dL (ref 70–99)

## 2022-05-08 LAB — TSH: TSH: 2.215 u[IU]/mL (ref 0.350–4.500)

## 2022-05-08 LAB — MAGNESIUM: Magnesium: 2 mg/dL (ref 1.7–2.4)

## 2022-05-08 LAB — PHOSPHORUS: Phosphorus: 3.6 mg/dL (ref 2.5–4.6)

## 2022-05-08 MED ORDER — SODIUM CHLORIDE 0.9 % IV SOLN: INTRAVENOUS | Status: AC

## 2022-05-08 MED ORDER — INSULIN DETEMIR 100 UNIT/ML ~~LOC~~ SOLN
25.0000 [IU] | Freq: Every day | SUBCUTANEOUS | Status: DC
  Filled 2022-05-08: qty 0.25

## 2022-05-08 MED ORDER — SODIUM CHLORIDE 0.9 % IV SOLN
1.0000 g | Freq: Once | INTRAVENOUS | Status: AC
  Administered 2022-05-08: 1 g via INTRAVENOUS
  Filled 2022-05-08: qty 10

## 2022-05-08 MED ORDER — SODIUM CHLORIDE 0.9 % IV BOLUS
1000.0000 mL | Freq: Once | INTRAVENOUS | Status: AC
  Administered 2022-05-08: 1000 mL via INTRAVENOUS

## 2022-05-08 MED ORDER — HYDROCODONE-ACETAMINOPHEN 5-325 MG PO TABS: 1.0000 | ORAL_TABLET | ORAL | Status: DC | PRN

## 2022-05-08 MED ORDER — INSULIN ASPART 100 UNIT/ML IJ SOLN
0.0000 [IU] | INTRAMUSCULAR | Status: DC
  Administered 2022-05-11: 1 [IU] via SUBCUTANEOUS
  Administered 2022-05-11: 5 [IU] via SUBCUTANEOUS

## 2022-05-08 MED ORDER — INSULIN DEGLUDEC 100 UNIT/ML ~~LOC~~ SOPN: 25.0000 [IU] | PEN_INJECTOR | Freq: Once | SUBCUTANEOUS | Status: DC

## 2022-05-08 MED ORDER — ACETAMINOPHEN 650 MG RE SUPP: 650.0000 mg | Freq: Four times a day (QID) | RECTAL | Status: DC | PRN

## 2022-05-08 MED ORDER — INSULIN GLARGINE-YFGN 100 UNIT/ML ~~LOC~~ SOLN
20.0000 [IU] | Freq: Once | SUBCUTANEOUS | Status: AC
  Administered 2022-05-08: 20 [IU] via SUBCUTANEOUS
  Filled 2022-05-08: qty 200

## 2022-05-08 MED ORDER — SODIUM CHLORIDE 0.9 % IV SOLN
1.0000 g | INTRAVENOUS | Status: DC
  Administered 2022-05-09 – 2022-05-10 (×2): 1 g via INTRAVENOUS
  Filled 2022-05-08 (×3): qty 10

## 2022-05-08 MED ORDER — LIDOCAINE HCL URETHRAL/MUCOSAL 2 % EX GEL
1.0000 | Freq: Once | CUTANEOUS | Status: AC
  Administered 2022-05-08: 1 via URETHRAL
  Filled 2022-05-08: qty 11

## 2022-05-08 MED ORDER — FENTANYL CITRATE PF 50 MCG/ML IJ SOSY
12.5000 ug | PREFILLED_SYRINGE | INTRAMUSCULAR | Status: DC | PRN
  Administered 2022-05-09: 50 ug via INTRAVENOUS
  Filled 2022-05-08: qty 1

## 2022-05-08 MED ORDER — IOHEXOL 300 MG/ML  SOLN
100.0000 mL | Freq: Once | INTRAMUSCULAR | Status: AC | PRN
  Administered 2022-05-08: 65 mL via INTRAVENOUS

## 2022-05-08 MED ORDER — FENTANYL CITRATE PF 50 MCG/ML IJ SOSY
50.0000 ug | PREFILLED_SYRINGE | Freq: Once | INTRAMUSCULAR | Status: AC
  Administered 2022-05-08: 50 ug via INTRAVENOUS
  Filled 2022-05-08: qty 1

## 2022-05-08 MED ORDER — ACETAMINOPHEN 160 MG/5ML PO SOLN
650.0000 mg | Freq: Four times a day (QID) | ORAL | Status: DC | PRN
  Administered 2022-05-10 – 2022-05-11 (×2): 650 mg
  Filled 2022-05-08 (×3): qty 20.3

## 2022-05-08 NOTE — Assessment & Plan Note (Signed)
-   treat with Rocephin         await results of urine culture and adjust antibiotic coverage as needed  

## 2022-05-08 NOTE — ED Triage Notes (Signed)
Abdominal pain, n/v since Wednesday Diabetic has not been able to take today Cbg at home around 200s, normal per patient

## 2022-05-08 NOTE — Assessment & Plan Note (Signed)
Obtain electrolytes rehydrate and follow

## 2022-05-08 NOTE — Subjective & Objective (Signed)
Presents with 3 days of abd pain n/v hx of DM2 on insulin Patient have had prior surgical intervention such as appendectomy and partial pancreatic resection with abscess formation Right ports bloating abdominal distention cannot keep any p.o. down.  Last bowel movement was on Wednesday it was just a tiny bit now not passing more gas.  Has significant abdominal pain and vomiting.

## 2022-05-08 NOTE — Progress Notes (Signed)
MD has paged waiting for new orders, NG tube hooked back to suction, Patient is A&Ox4 can ambulate with assistance.

## 2022-05-08 NOTE — Assessment & Plan Note (Signed)
-   Order Sensitive  SSI   -  switch to   levemir 25 units,  -  check TSH and HgA1C

## 2022-05-08 NOTE — H&P (Signed)
Sara Daniels [id number] DOB: 08-16-1945 DOA: 05/08/2022     PCP: Fanny Bien, MD   Outpatient Specialists:     GI Dr.    Havery Moros, Carlota Raspberry, MD   Patient arrived to ER on 05/08/22 at 1543 Referred by Attending Toy Baker, MD   Patient coming from:    home Lives With family    Chief Complaint:   Chief Complaint  Patient presents with   Abdominal Pain    HPI: Sara Daniels is a 77 y.o. female with medical history significant of  Dm2, hypothyroidism, pancreatic resection,    Presented with   N/V abd pain  Presents with 3 days of abd pain n/v hx of DM2 on insulin Patient have had prior surgical intervention such as appendectomy and partial pancreatic resection with abscess formation Right ports bloating abdominal distention cannot keep any p.o. down.  Last bowel movement was on Wednesday it was just a tiny bit now not passing more gas.  Has significant abdominal pain and vomiting.   No prior hx of SBO No alcohol no tobacco  Reports decreased urination,no dysuria, some superpubic pain She had some chills, fatigue, no confusion    Regarding pertinent Chronic problems:     HTN - Benicar      DM 2 -  on insulin,     Hypothyroidism:  on synthroid     While in ER: Clinical Course as of 05/08/22 2150  Sat May 08, 2022  1819 Dr Zenia Resides general surgery to consult, recommending NG tube and medical admission.  Nursing will place NG tube.  I will page hospitalist for admission.  Patient and her daughter updated. [MT]  [id number] I spoke to the hospitalist Dr Roel Cluck who admit the patient but recommended that we give the long-acting insulin the patient did not take this morning.  We will give her a lower dose because she has been n.p.o., 25 units per my discussion with the hospitalist instead of the regular 36 units. [MT]    Clinical Course User Index [MT] Wyvonnia Dusky, MD      CXR - No acute cardiopulmonary process. NGT appropriately  positioned.   CTabd/pelvis -  IMPRESSION: 1. Findings are compatible with severe small-bowel obstruction, as detailed above. An exact transition point is not identified, however, the portions of the small bowel that appear dilated are the distal jejunum and proximal ileum. Surgical consultation is recommended. 2. Atrophy of the body and tail of the pancreas associated with narrowed splenic vein and portosystemic collateral pathways, including large gastric varices, as above. This may relate to prior episodes of pancreatitis. Associated with this, there is a well-defined benign-appearing cystic lesion in the region of the tail of the pancreas, presumably a chronic pancreatic pseudocyst or other benign lesion. 3. Cholelithiasis and biliary sludge in the gallbladder without evidence of acute cholecystitis. 4. Colonic diverticulosis without evidence of acute diverticulitis at this time. 5. Aortic atherosclerosis. 6. Additional incidental findings, as above.    Following Medications were ordered in ER: Medications  sodium chloride 0.9 % bolus 1,000 mL (0 mLs Intravenous Stopped 05/08/22 1800)  iohexol (OMNIPAQUE) 300 MG/ML solution 100 mL (65 mLs Intravenous Contrast Given 05/08/22 1715)  cefTRIAXone (ROCEPHIN) 1 g in sodium chloride 0.9 % 100 mL IVPB (0 g Intravenous Stopped 05/08/22 2044)  lidocaine (XYLOCAINE) 2 % jelly 1 Application (1 Application Urethral Given 05/08/22 1933)  fentaNYL (SUBLIMAZE) injection 50 mcg (50 mcg Intravenous Given 05/08/22 1904)  insulin glargine-yfgn (SEMGLEE) injection  20 Units (20 Units Subcutaneous Given 05/08/22 2012)    _______________________________________________________ ER Provider Called:  Gen Surgery   Dr. Zenia Resides They Recommend admit to medicine    Place NGT will see in consult   ED Triage Vitals  Enc Vitals Group     BP 05/08/22 1554 (!) 139/92     Pulse Rate 05/08/22 1554 90     Resp 05/08/22 1554 18     Temp 05/08/22 1554 98.6 F (37 C)      Temp Source 05/08/22 1925 Oral     SpO2 05/08/22 1554 98 %     Weight --      Height --      Head Circumference --      Peak Flow --      Pain Score 05/08/22 1654 6     Pain Loc --      Pain Edu? --      Excl. in [place]? --   TMAX(24)@     _________________________________________ Significant initial  Findings: Abnormal Labs Reviewed  COMPREHENSIVE METABOLIC PANEL - Abnormal; Notable for the following components:      Result Value   Chloride 97 (*)    Glucose, Bld 191 (*)    BUN 38 (*)    Creatinine, Ser 1.51 (*)    GFR, Estimated 35 (*)    All other components within normal limits  URINALYSIS, ROUTINE W REFLEX MICROSCOPIC - Abnormal; Notable for the following components:   APPearance HAZY (*)    Protein, ur TRACE (*)    Leukocytes,Ua MODERATE (*)    Bacteria, UA MANY (*)    All other components within normal limits  URINALYSIS, W/ REFLEX TO CULTURE (INFECTION SUSPECTED) - Abnormal; Notable for the following components:   Specific Gravity, Urine >1.046 (*)    Protein, ur TRACE (*)    Leukocytes,Ua SMALL (*)    Bacteria, UA RARE (*)    All other components within normal limits  CBG MONITORING, ED - Abnormal; Notable for the following components:   Glucose-Capillary 120 (*)    All other components within normal limits    ECG: Ordered Personally reviewed and interpreted by me showing: HR : 73 Rhythm:  NSR,    nonspecific changes,  QTC 425    The recent clinical data is shown below. Vitals:   05/08/22 1705 05/08/22 1925 05/08/22 2000 05/08/22 2030  BP: (!) 129/59 (!) 130/54 (!) 124/55 125/61  Pulse: 80 73 74 74  Resp: 18 18  18  $ Temp:  98.4 F (36.9 C)    TempSrc:  Oral    SpO2: 97% 96% 95% 96%    WBC     Component Value Date/Time   WBC 10.5 05/08/2022 1601      UA    evidence of UTI     Urine analysis:    Component Value Date/Time   COLORURINE YELLOW 05/08/2022 1802   APPEARANCEUR CLEAR 05/08/2022 1802   LABSPEC >1.046 (H) 05/08/2022 1802   PHURINE  6.5 05/08/2022 1802   GLUCOSEU NEGATIVE 05/08/2022 1802   HGBUR NEGATIVE 05/08/2022 1802   BILIRUBINUR NEGATIVE 05/08/2022 1802   KETONESUR NEGATIVE 05/08/2022 1802   PROTEINUR TRACE (A) 05/08/2022 1802   NITRITE NEGATIVE 05/08/2022 1802   LEUKOCYTESUR SMALL (A) 05/08/2022 1802    No results found for this or any previous visit.   _______________________________________________ Hospitalist was called for admission for   Small bowel obstruction  and UTI    The following Work up has been ordered  so far:  Orders Placed This Encounter  Procedures   Urine Culture (for pregnant, neutropenic or urologic patients or patients with an indwelling urinary catheter)   CT ABDOMEN PELVIS W CONTRAST   DG Chest Portable 1 View   Lipase, blood   Comprehensive metabolic panel   CBC   Urinalysis, Routine w reflex microscopic -Urine, Clean Catch   Urinalysis, w/ Reflex to Culture (Infection Suspected) -Urine, Clean Catch   Magnesium   Diet NPO time specified   Gastric tube   Cardiac Monitoring Continuous x 12 hours Indications for use: Other; other indications for use: moniotr for electrolyte abnormalities   Consult to general surgery  Small bowel obstruction   Consult to hospitalist   CBG monitoring, ED   Insert peripheral IV   Admit to Inpatient (patient's expected length of stay will be greater than 2 midnights or inpatient only procedure)     OTHER Significant initial  Findings:  labs showing:    Recent Labs  Lab 05/08/22 1601  NA 135  K 4.3  CO2 25  GLUCOSE 191*  BUN 38*  CREATININE 1.51*  CALCIUM 10.3  MG 2.0      Recent Labs  Lab 05/08/22 1601  AST 23  ALT 25  ALKPHOS 67  BILITOT 1.2  PROT 8.0  ALBUMIN 5.0   Lab Results  Component Value Date   CALCIUM 10.3 05/08/2022     Plt: Lab Results  Component Value Date   PLT 213 05/08/2022       Recent Labs  Lab 05/08/22 1601  WBC 10.5  HGB 14.6  HCT 42.2  MCV 90.0  PLT 213    HG/HCT  stable,      Component Value Date/Time   HGB 14.6 05/08/2022 1601   HCT 42.2 05/08/2022 1601   MCV 90.0 05/08/2022 1601     Recent Labs  Lab 05/08/22 1601  LIPASE 13    DM  labs:  HbA1C: No results for input(s): "HGBA1C" in the last 8760 hours.     CBG (last 3)  Recent Labs    05/08/22 2026  GLUCAP 120*          Cultures: No results found for: "SDES", "SPECREQUEST", "CULT", "REPTSTATUS"   Radiological Exams on Admission: DG Chest Portable 1 View  Result Date: 05/08/2022 CLINICAL DATA:  NG placement EXAM: PORTABLE CHEST - 1 VIEW COMPARISON:  None Available. FINDINGS: Cardiac silhouette is unremarkable. No pneumothorax or pleural effusion. The lungs are clear. Aorta is calcified. The visualized skeletal structures are unremarkable. NG tube tip is below the diaphragm in the region of the stomach. IMPRESSION: No acute cardiopulmonary process.  NGT appropriately positioned. Electronically Signed   By: Sammie Bench M.D.   On: 05/08/2022 20:02   CT ABDOMEN PELVIS W CONTRAST  Addendum Date: 05/08/2022   ADDENDUM REPORT: 05/08/2022 17:58 ADDENDUM: As mentioned in the body of the report, there is a small amount of gas non dependently in the lumen of the urinary bladder. This may be iatrogenic in the setting of recent bladder catheterization. In the absence of recent history of catheterization, correlation with urinalysis would be recommended to exclude the possibility of urinary tract infection with gas-forming organisms. Electronically Signed   By: Vinnie Langton M.D.   On: 05/08/2022 17:58   Result Date: 05/08/2022 CLINICAL DATA:  77 year old female with abdominal pain, nausea and vomiting. Concern for potential small bowel obstruction. EXAM: CT ABDOMEN AND PELVIS WITH CONTRAST TECHNIQUE: Multidetector CT imaging of the abdomen and pelvis  was performed using the standard protocol following bolus administration of intravenous contrast. RADIATION DOSE REDUCTION: This exam was performed  according to the departmental dose-optimization program which includes automated exposure control, adjustment of the mA and/or kV according to patient size and/or use of iterative reconstruction technique. CONTRAST:  61m OMNIPAQUE IOHEXOL 300 MG/ML  SOLN COMPARISON:  No priors. FINDINGS: Lower chest: Widespread scarring in the lung bases bilaterally. Atherosclerotic calcifications in the descending thoracic aorta as well as the left anterior descending coronary artery. Hepatobiliary: Diffuse low attenuation throughout the hepatic parenchyma, indicative of a background of hepatic steatosis. No discrete cystic or solid hepatic lesions. No intra or extrahepatic biliary ductal dilatation. Multiple calcified gallstones lying dependently in the gallbladder. There is also a large volume of intermediate attenuation fluid in the gallbladder, presumably biliary sludge. Gallbladder is moderately distended. Gallbladder wall thickness appears normal. No pericholecystic fluid or surrounding inflammatory changes to indicate an acute cholecystitis at this time. Pancreas: Atrophy throughout the body and tail of the pancreas. In the region of the pancreatic tail there is a well-defined 3.8 x 3.3 cm low-attenuation lesion, most likely to represent a pancreatic pseudocyst. No solid pancreatic mass. No pancreatic ductal dilatation. No pancreatic or peripancreatic fluid collections or inflammatory changes. Spleen: Unremarkable. Adrenals/Urinary Tract: Right kidney and bilateral adrenal glands are normal in appearance. Mild atrophy of the left kidney where there is also multifocal cortical thinning, likely to reflect chronic post infectious or inflammatory scarring. No hydroureteronephrosis. Urinary bladder is nearly completely decompressed. Small amount of gas non dependently in the urinary bladder is noted. Stomach/Bowel: Numerous varices are noted in the proximal aspect of the stomach. Stomach is otherwise grossly unremarkable in  appearance. There are multiple dilated loops of mid small bowel, which appear to represent distal jejunum and proximal ileum. Distal small bowel is completely decompressed, as is the colon. Small bowel loops measure up to 5.6 cm in diameter, with multiple air-fluid levels, and some bowel loops demonstrating internal feculent contents, suggesting bacterial overgrowth. An exact transition point is not confidently identified on today's CT examination. Numerous colonic diverticula are noted, particularly in the sigmoid colon, without focal surrounding inflammatory changes to indicate an acute diverticulitis at this time. The appendix is not confidently identified and may be surgically absent. Regardless, there are no inflammatory changes noted adjacent to the cecum to suggest the presence of an acute appendicitis at this time. Vascular/Lymphatic: Atherosclerotic calcifications are noted in the abdominal aorta and pelvic vasculature, without evidence of aneurysm or dissection. Splenic vein is patent, but diminutive in size with numerous portosystemic collateral pathways, including large gastric varices. No lymphadenopathy noted in the abdomen or pelvis. Reproductive: Uterus and ovaries are unremarkable in appearance. Other: No significant volume of ascites.  No pneumoperitoneum. Musculoskeletal: There are no aggressive appearing lytic or blastic lesions noted in the visualized portions of the skeleton. IMPRESSION: 1. Findings are compatible with severe small-bowel obstruction, as detailed above. An exact transition point is not identified, however, the portions of the small bowel that appear dilated are the distal jejunum and proximal ileum. Surgical consultation is recommended. 2. Atrophy of the body and tail of the pancreas associated with narrowed splenic vein and portosystemic collateral pathways, including large gastric varices, as above. This may relate to prior episodes of pancreatitis. Associated with this, there  is a well-defined benign-appearing cystic lesion in the region of the tail of the pancreas, presumably a chronic pancreatic pseudocyst or other benign lesion. 3. Cholelithiasis and biliary sludge in the gallbladder without  evidence of acute cholecystitis. 4. Colonic diverticulosis without evidence of acute diverticulitis at this time. 5. Aortic atherosclerosis. 6. Additional incidental findings, as above. Electronically Signed: By: Vinnie Langton M.D. On: 05/08/2022 17:44   _______________________________________________________________________________________________________ Latest  Blood pressure 125/61, pulse 74, temperature 98.4 F (36.9 C), temperature source Oral, resp. rate 18, SpO2 96 %.   Vitals  labs and radiology finding personally reviewed  Review of Systems:    Pertinent positives include:  abdominal pain, nausea, vomiting  Constitutional:  No weight loss, night sweats, Fevers, chills, fatigue, weight loss  HEENT:  No headaches, Difficulty swallowing,Tooth/dental problems,Sore throat,  No sneezing, itching, ear ache, nasal congestion, post nasal drip,  Cardio-vascular:  No chest pain, Orthopnea, PND, anasarca, dizziness, palpitations.no Bilateral lower extremity swelling  GI:  No heartburn, indigestion, , diarrhea, change in bowel habits, loss of appetite, melena, blood in stool, hematemesis Resp:  no shortness of breath at rest. No dyspnea on exertion, No excess mucus, no productive cough, No non-productive cough, No coughing up of blood.No change in color of mucus.No wheezing. Skin:  no rash or lesions. No jaundice GU:  no dysuria, change in color of urine, no urgency or frequency. No straining to urinate.  No flank pain.  Musculoskeletal:  No joint pain or no joint swelling. No decreased range of motion. No back pain.  Psych:  No change in mood or affect. No depression or anxiety. No memory loss.  Neuro: no localizing neurological complaints, no tingling, no weakness,  no double vision, no gait abnormality, no slurred speech, no confusion  All systems reviewed and apart from [place] all are negative _______________________________________________________________________________________________ Past Medical History:   Past Medical History:  Diagnosis Date   Acute necrotizing pancreatitis 2014   Blood transfusion without reported diagnosis    Diabetes mellitus without complication ([place])    Hx of blood clots 2014   during ICU- blood clot caused cardiac arrest    Hypertension    Thyroid disease       Past Surgical History:  Procedure Laterality Date   APPENDECTOMY  2007   PANCREAS SURGERY  06/2013   necrosis area removed-AT [facility]-pt has 10% working pancreas     Social History:  Ambulatory   independently      reports that she has never smoked. She has never used smokeless tobacco. She reports that she does not currently use alcohol. She reports that she does not use drugs.   Family History:    Family History  Problem Relation Age of Onset   Ovarian cancer Mother    Pancreatic cancer Father    Colon cancer Neg Hx    Esophageal cancer Neg Hx    Stomach cancer Neg Hx    Rectal cancer Neg Hx    ______________________________________________________________________________________________ Allergies: No Known Allergies   Prior to Admission medications   Medication Sig Start Date End Date Taking? Authorizing Provider  Blood Glucose Monitoring Suppl (GLUCOCOM BLOOD GLUCOSE MONITOR) DEVI by [place].(Non-Drug; Combo Route) route. daily    [provider]  Cholecalciferol 5000 units capsule Take 1 tablet by mouth daily. 11/22/14   [provider]  glucose blood (ACCU-CHEK ACTIVE STRIPS) test strip by Misc.(Non-Drug; Combo Route) route.    [provider]  insulin aspart protamine - aspart (NOVOLOG 70/30 MIX) (70-30) 100 UNIT/ML FlexPen Inject 5 Units into the skin daily.  11/21/13   [provider]  Insulin Pen  Needle (BD PEN NEEDLE NANO U/F) 32G X 4 MM MISC  05/24/13  [provider]  levothyroxine (SYNTHROID, LEVOTHROID) 75 MCG tablet Take 1 tablet by mouth daily. 06/13/17   [provider]  Na Sulfate-K Sulfate-Mg Sulf 17.5-3.13-1.6 GM/177ML SOLN Suprep (no substitutions)-TAKE AS DIRECTED. 07/27/17   Armbruster, Carlota Raspberry, MD  olmesartan (BENICAR) 20 MG tablet Take 20 mg by mouth daily. 07/04/17   [provider]  TRESIBA FLEXTOUCH 200 UNIT/ML SOPN Inject 46 Units into the skin daily.  07/11/17   [provider]    ___________________________________________________________________________________________________ Physical Exam:    05/08/2022    8:30 PM 05/08/2022    8:00 PM 05/08/2022    7:25 PM  Vitals with BMI  Systolic [id number]  Diastolic 61 55 54  Pulse 74 74 73      1. General:  in No  Acute distress    Chronically ill -appearing 2. Psychological: Alert and  Oriented 3. Head/ENT:    Dry Mucous Membranes                          Head Non traumatic, neck supple                           Poor Dentition 4. SKIN:  decreased Skin turgor,  Skin clean Dry and intact no rash 5. Heart: Regular rate and rhythm no  Murmur, no Rub or gallop 6. Lungs:  Clear to auscultation bilaterally, no wheezes or crackles   7. Abdomen: Soft,  non-tender,  distended   obese  bowel sounds  diminished 8. Lower extremities: no clubbing, cyanosis, no  edema 9. Neurologically Grossly intact, moving all 4 extremities equally   10. MSK: Normal range of motion    Chart has been reviewed  ______________________________________________________________________________________________  Assessment/Plan  77 y.o. female with medical history significant of  Dm2, hypothyroidism  Admitted for   Small bowel obstruction . AKI, UTI    Present on Admission:  SBO (small bowel obstruction) (HCC)  AKI (acute kidney injury) ([place])  UTI (urinary tract infection)  Abnormal ECG Dm 2    SBO  (small bowel obstruction) (HCC)  Likely cause  adhesions,     - admit for conservative management  - NG tube - NPO - KUB in AM - appreciate General surgery consult.   Controlled type 2 diabetes mellitus without complication (HCC)  - Order Sensitive  SSI   -  switch to   levemir 25 units,  -  check TSH and HgA1C     AKI (acute kidney injury) ([place]) Obtain electrolytes rehydrate and follow  UTI (urinary tract infection)  - treat with Rocephin        await results of urine culture and adjust antibiotic coverage as needed   Abnormal ECG No evidence of CP  Baseline ECG with none specific findings  Will repeat in AM or sooner if pt has any CP   Other plan as per orders.  DVT prophylaxis:  SCD     Code Status:    Code Status: Not on file FULL CODE  as per patient   I had personally discussed CODE STATUS with patient      Family Communication:   Family not at  Bedside    Disposition Plan:    To home once workup is complete and patient is stable   Following barriers for discharge:  Will need to be able to tolerate PO                                             Will need consultants to evaluate patient prior to discharge    Consults called:    general surgery is aware  Admission status:  ED Disposition     ED Disposition  Admit   Condition  --   [place]: [place] [[id number]]  Level of Care: Telemetry [5]  Admit to tele based on following criteria: Other see comments  Comments: dehydration  May admit patient to [facility] or [facility] if equivalent level of care is available:: Yes  Interfacility transfer: Yes  Covid Evaluation: Asymptomatic - no recent exposure (last 10 days) testing not required  Diagnosis: SBO (small bowel obstruction) [id number]  Admitting Physician: Toy Baker [id number]]  Attending Physician: Toy Baker [id number]  Certification:: I certify  this patient will need inpatient services for at least 2 midnights  Estimated Length of Stay: 2              inpatient     I Expect 2 midnight stay secondary to severity of patient's current illness need for inpatient interventions justified by the following:     Severe lab/radiological/exam abnormalities including:    SBO and extensive comorbidities including:  DM2     That are currently affecting medical management.   I expect  patient to be hospitalized for 2 midnights requiring inpatient medical care.  Patient is at high risk for adverse outcome (such as loss of life or disability) if not treated.  Indication for inpatient stay as follows:    Need for operative/procedural  intervention     Need for   IV fluids, IV pain medications,      Level of care     tele  For 12H      Almena Hokenson 05/09/2022, 12:03 AM    [facility]     after 2 AM please page floor coverage PA If 7AM-7PM, please contact the day team taking care of the patient using [email or web address]   Patient was evaluated in the context of the global COVID-19 pandemic, which necessitated consideration that the patient might be at risk for infection with the SARS-CoV-2 virus that causes COVID-19. Institutional protocols and algorithms that pertain to the evaluation of patients at risk for COVID-19 are in a state of rapid change based on information released by regulatory bodies including the CDC and federal and state organizations. These policies and algorithms were followed during the patient's care.

## 2022-05-08 NOTE — Assessment & Plan Note (Signed)
Likely cause  adhesions,     - admit for conservative management  - NG tube - NPO - KUB in AM - appreciate General surgery consult.

## 2022-05-08 NOTE — ED Provider Notes (Signed)
[facility] Provider Note   CSN: [id number] Arrival date & time: 05/08/22  1543     History  Chief Complaint  Patient presents with   Abdominal Pain    Sara Daniels is a 77 y.o. female with history of appendectomy, partial pancreatic resection, diabetes on insulin, presenting to the ED with nausea and vomiting for 4 days.  Reports symptoms began on 06/22/2022.  She has felt bloated, abdominal distention, cannot keep down any food or fluids.  She had 1 bowel movement on 06/22/22 and has passed a tiny bit of gas but not regularly.  No history of bowel obstructions.  She is not currently having abdominal pain.  She did vomit earlier today.  She says she takes [place] 36 units in the morning, has taken her doses week except for this morning.  She is on a NovoLog sliding scale  HPI     Home Medications Prior to Admission medications   Medication Sig Start Date End Date Taking? Authorizing Provider  Blood Glucose Monitoring Suppl (GLUCOCOM BLOOD GLUCOSE MONITOR) DEVI by [place].(Non-Drug; Combo Route) route. daily    [provider]  Cholecalciferol 5000 units capsule Take 1 tablet by mouth daily. 11/22/14   [provider]  glucose blood (ACCU-CHEK ACTIVE STRIPS) test strip by Misc.(Non-Drug; Combo Route) route.    [provider]  insulin aspart protamine - aspart (NOVOLOG 70/30 MIX) (70-30) 100 UNIT/ML FlexPen Inject 5 Units into the skin daily.  11/21/13   [provider]  Insulin Pen Needle (BD PEN NEEDLE NANO U/F) 32G X 4 MM MISC  05/24/13   [provider]  levothyroxine (SYNTHROID, LEVOTHROID) 75 MCG tablet Take 1 tablet by mouth daily. 06/13/17   [provider]  Na Sulfate-K Sulfate-Mg Sulf 17.5-3.13-1.6 GM/177ML SOLN Suprep (no substitutions)-TAKE AS DIRECTED. 07/27/17   Armbruster, Carlota Raspberry, MD  olmesartan (BENICAR) 20 MG tablet Take 20 mg by mouth daily. 07/04/17   [provider]  TRESIBA FLEXTOUCH 200 UNIT/ML SOPN Inject 46 Units into the skin daily.  07/11/17   [provider]      Allergies    Patient has no known allergies.    Review of Systems   Review of Systems  Physical Exam Updated Vital Signs BP (!) 141/56 (BP Location: Right Arm)   Pulse 75   Temp 97.9 F (36.6 C) (Oral)   Resp 18   SpO2 96%  Physical Exam Constitutional:      General: She is not in acute distress. HENT:     Head: Normocephalic and atraumatic.  Eyes:     Conjunctiva/sclera: Conjunctivae normal.     Pupils: Pupils are equal, round, and reactive to light.  Cardiovascular:     Rate and Rhythm: Normal rate and regular rhythm.  Pulmonary:     Effort: Pulmonary effort is normal. No respiratory distress.  Abdominal:     General: There is distension.     Tenderness: There is no abdominal tenderness.  Skin:    General: Skin is warm and dry.  Neurological:     General: No focal deficit present.     Mental Status: She is alert. Mental status is at baseline.  Psychiatric:        Mood and Affect: Mood normal.        Behavior: Behavior normal.     ED Results / Procedures / Treatments   Labs (all labs ordered are listed, but only abnormal results are displayed) Labs  Reviewed  COMPREHENSIVE METABOLIC PANEL - Abnormal; Notable for the following components:      Result Value   Chloride 97 (*)    Glucose, Bld 191 (*)    BUN 38 (*)    Creatinine, Ser 1.51 (*)    GFR, Estimated 35 (*)    All other components within normal limits  URINALYSIS, ROUTINE W REFLEX MICROSCOPIC - Abnormal; Notable for the following components:   APPearance HAZY (*)    Protein, ur TRACE (*)    Leukocytes,Ua MODERATE (*)    Bacteria, UA MANY (*)    All other components within normal limits  URINALYSIS, W/ REFLEX TO CULTURE (INFECTION SUSPECTED) - Abnormal; Notable for the following components:   Specific Gravity, Urine >1.046 (*)    Protein, ur TRACE (*)    Leukocytes,Ua  SMALL (*)    Bacteria, UA RARE (*)    All other components within normal limits  CBG MONITORING, ED - Abnormal; Notable for the following components:   Glucose-Capillary 120 (*)    All other components within normal limits  URINE CULTURE  LIPASE, BLOOD  CBC  MAGNESIUM  HEMOGLOBIN A1C  COMPREHENSIVE METABOLIC PANEL  CBC  MAGNESIUM  PHOSPHORUS    EKG None  Radiology DG Chest Portable 1 View  Result Date: 05/08/2022 CLINICAL DATA:  NG placement EXAM: PORTABLE CHEST - 1 VIEW COMPARISON:  None Available. FINDINGS: Cardiac silhouette is unremarkable. No pneumothorax or pleural effusion. The lungs are clear. Aorta is calcified. The visualized skeletal structures are unremarkable. NG tube tip is below the diaphragm in the region of the stomach. IMPRESSION: No acute cardiopulmonary process.  NGT appropriately positioned. Electronically Signed   By: Sammie Bench M.D.   On: 05/08/2022 20:02   CT ABDOMEN PELVIS W CONTRAST  Addendum Date: 05/08/2022   ADDENDUM REPORT: 05/08/2022 17:58 ADDENDUM: As mentioned in the body of the report, there is a small amount of gas non dependently in the lumen of the urinary bladder. This may be iatrogenic in the setting of recent bladder catheterization. In the absence of recent history of catheterization, correlation with urinalysis would be recommended to exclude the possibility of urinary tract infection with gas-forming organisms. Electronically Signed   By: Vinnie Langton M.D.   On: 05/08/2022 17:58   Result Date: 05/08/2022 CLINICAL DATA:  77 year old female with abdominal pain, nausea and vomiting. Concern for potential small bowel obstruction. EXAM: CT ABDOMEN AND PELVIS WITH CONTRAST TECHNIQUE: Multidetector CT imaging of the abdomen and pelvis was performed using the standard protocol following bolus administration of intravenous contrast. RADIATION DOSE REDUCTION: This exam was performed according to the departmental dose-optimization program which  includes automated exposure control, adjustment of the mA and/or kV according to patient size and/or use of iterative reconstruction technique. CONTRAST:  73m OMNIPAQUE IOHEXOL 300 MG/ML  SOLN COMPARISON:  No priors. FINDINGS: Lower chest: Widespread scarring in the lung bases bilaterally. Atherosclerotic calcifications in the descending thoracic aorta as well as the left anterior descending coronary artery. Hepatobiliary: Diffuse low attenuation throughout the hepatic parenchyma, indicative of a background of hepatic steatosis. No discrete cystic or solid hepatic lesions. No intra or extrahepatic biliary ductal dilatation. Multiple calcified gallstones lying dependently in the gallbladder. There is also a large volume of intermediate attenuation fluid in the gallbladder, presumably biliary sludge. Gallbladder is moderately distended. Gallbladder wall thickness appears normal. No pericholecystic fluid or surrounding inflammatory changes to indicate an acute cholecystitis at this time. Pancreas: Atrophy throughout the body and tail of the pancreas.  In the region of the pancreatic tail there is a well-defined 3.8 x 3.3 cm low-attenuation lesion, most likely to represent a pancreatic pseudocyst. No solid pancreatic mass. No pancreatic ductal dilatation. No pancreatic or peripancreatic fluid collections or inflammatory changes. Spleen: Unremarkable. Adrenals/Urinary Tract: Right kidney and bilateral adrenal glands are normal in appearance. Mild atrophy of the left kidney where there is also multifocal cortical thinning, likely to reflect chronic post infectious or inflammatory scarring. No hydroureteronephrosis. Urinary bladder is nearly completely decompressed. Small amount of gas non dependently in the urinary bladder is noted. Stomach/Bowel: Numerous varices are noted in the proximal aspect of the stomach. Stomach is otherwise grossly unremarkable in appearance. There are multiple dilated loops of mid small bowel,  which appear to represent distal jejunum and proximal ileum. Distal small bowel is completely decompressed, as is the colon. Small bowel loops measure up to 5.6 cm in diameter, with multiple air-fluid levels, and some bowel loops demonstrating internal feculent contents, suggesting bacterial overgrowth. An exact transition point is not confidently identified on today's CT examination. Numerous colonic diverticula are noted, particularly in the sigmoid colon, without focal surrounding inflammatory changes to indicate an acute diverticulitis at this time. The appendix is not confidently identified and may be surgically absent. Regardless, there are no inflammatory changes noted adjacent to the cecum to suggest the presence of an acute appendicitis at this time. Vascular/Lymphatic: Atherosclerotic calcifications are noted in the abdominal aorta and pelvic vasculature, without evidence of aneurysm or dissection. Splenic vein is patent, but diminutive in size with numerous portosystemic collateral pathways, including large gastric varices. No lymphadenopathy noted in the abdomen or pelvis. Reproductive: Uterus and ovaries are unremarkable in appearance. Other: No significant volume of ascites.  No pneumoperitoneum. Musculoskeletal: There are no aggressive appearing lytic or blastic lesions noted in the visualized portions of the skeleton. IMPRESSION: 1. Findings are compatible with severe small-bowel obstruction, as detailed above. An exact transition point is not identified, however, the portions of the small bowel that appear dilated are the distal jejunum and proximal ileum. Surgical consultation is recommended. 2. Atrophy of the body and tail of the pancreas associated with narrowed splenic vein and portosystemic collateral pathways, including large gastric varices, as above. This may relate to prior episodes of pancreatitis. Associated with this, there is a well-defined benign-appearing cystic lesion in the region  of the tail of the pancreas, presumably a chronic pancreatic pseudocyst or other benign lesion. 3. Cholelithiasis and biliary sludge in the gallbladder without evidence of acute cholecystitis. 4. Colonic diverticulosis without evidence of acute diverticulitis at this time. 5. Aortic atherosclerosis. 6. Additional incidental findings, as above. Electronically Signed: By: Vinnie Langton M.D. On: 05/08/2022 17:44    Procedures Procedures    Medications Ordered in ED Medications  cefTRIAXone (ROCEPHIN) 1 g in sodium chloride 0.9 % 100 mL IVPB (has no administration in time range)  0.9 %  sodium chloride infusion (has no administration in time range)  acetaminophen (TYLENOL) tablet 650 mg (has no administration in time range)    Or  acetaminophen (TYLENOL) suppository 650 mg (has no administration in time range)  HYDROcodone-acetaminophen (NORCO/VICODIN) 5-325 MG per tablet 1-2 tablet (has no administration in time range)  insulin detemir (LEVEMIR) injection 25 Units (has no administration in time range)  insulin aspart (novoLOG) injection 0-9 Units (has no administration in time range)  fentaNYL (SUBLIMAZE) injection 12.5-50 mcg (has no administration in time range)  sodium chloride 0.9 % bolus 1,000 mL (0 mLs Intravenous Stopped 05/08/22  1800)  iohexol (OMNIPAQUE) 300 MG/ML solution 100 mL (65 mLs Intravenous Contrast Given 05/08/22 1715)  cefTRIAXone (ROCEPHIN) 1 g in sodium chloride 0.9 % 100 mL IVPB (0 g Intravenous Stopped 05/08/22 2044)  lidocaine (XYLOCAINE) 2 % jelly 1 Application (1 Application Urethral Given 05/08/22 1933)  fentaNYL (SUBLIMAZE) injection 50 mcg (50 mcg Intravenous Given 05/08/22 1904)  insulin glargine-yfgn (SEMGLEE) injection 20 Units (20 Units Subcutaneous Given 05/08/22 2012)    ED Course/ Medical Decision Making/ A&P Clinical Course as of 05/08/22 2201  Sat May 08, 2022  1819 Dr Zenia Resides general surgery to consult, recommending NG tube and medical admission.  Nursing  will place NG tube.  I will page hospitalist for admission.  Patient and her daughter updated. [MT]  [id number] I spoke to the hospitalist Dr Roel Cluck who admit the patient but recommended that we give the long-acting insulin the patient did not take this morning.  We will give her a lower dose because she has been n.p.o., 25 units per my discussion with the hospitalist instead of the regular 36 units. [MT]    Clinical Course User Index [MT] Koriana Stepien, Carola Rhine, MD                             Medical Decision Making Amount and/or Complexity of Data Reviewed Labs: ordered. Radiology: ordered.  Risk Prescription drug management. Decision regarding hospitalization.   This patient presents to the ED with concern for abdominal pain, nausea, vomiting. This involves an extensive number of treatment options, and is a complaint that carries with it a high risk of complications and morbidity.  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  I ordered and personally interpreted labs.  The pertinent results include: White blood cell count within normal limits.  UA with moderate leukocytes, many bacteria.  I ordered imaging studies including CT abdomen pelvis with contrast I independently visualized and interpreted imaging which showed high-grade bowel obstruction.  Incidental gallstones noted.  Chronic pancreatic changes. I agree with the radiologist interpretation  I ordered medication including for pain and nausea Rocephin ordered for potential  UTI.  Fluid bolus also ordered for hydration.  I have reviewed the patients home medicines and have made adjustments as needed   I requested consultation with the general surgery,  and discussed lab and imaging findings as well as pertinent plan - they recommend: Admission to hospital, NG tube  After the interventions noted above, I reevaluated the patient and found that they have: stayed the same   Dispostion:  After consideration of the diagnostic results and the patients response to treatment, I feel that the patent would benefit from medical admission         Final Clinical Impression(s) / ED Diagnoses Final diagnoses:  Small bowel obstruction [facility])    Rx / DC Orders ED Discharge Orders     None         Wyvonnia Dusky, MD 05/08/22 2202

## 2022-05-09 ENCOUNTER — Inpatient Hospital Stay (HOSPITAL_COMMUNITY): Payer: Medicare HMO

## 2022-05-09 DIAGNOSIS — R9431 Abnormal electrocardiogram [ECG] [EKG]: Secondary | ICD-10-CM | POA: Diagnosis present

## 2022-05-09 DIAGNOSIS — K56609 Unspecified intestinal obstruction, unspecified as to partial versus complete obstruction: Secondary | ICD-10-CM | POA: Diagnosis not present

## 2022-05-09 LAB — COMPREHENSIVE METABOLIC PANEL
ALT: 29 U/L (ref 0–44)
AST: 27 U/L (ref 15–41)
Albumin: 3.6 g/dL (ref 3.5–5.0)
Alkaline Phosphatase: 55 U/L (ref 38–126)
Anion gap: 11 (ref 5–15)
BUN: 33 mg/dL — ABNORMAL HIGH (ref 8–23)
CO2: 21 mmol/L — ABNORMAL LOW (ref 22–32)
Calcium: 8.6 mg/dL — ABNORMAL LOW (ref 8.9–10.3)
Chloride: 105 mmol/L (ref 98–111)
Creatinine, Ser: 1.03 mg/dL — ABNORMAL HIGH (ref 0.44–1.00)
GFR, Estimated: 56 mL/min — ABNORMAL LOW (ref >60)
Glucose, Bld: 81 mg/dL (ref 70–99)
Potassium: 3.8 mmol/L (ref 3.5–5.1)
Sodium: 137 mmol/L (ref 135–145)
Total Bilirubin: 0.8 mg/dL (ref 0.3–1.2)
Total Protein: 6.6 g/dL (ref 6.5–8.1)

## 2022-05-09 LAB — CBC
HCT: 38.1 % (ref 36.0–46.0)
Hemoglobin: 12.9 g/dL (ref 12.0–15.0)
MCH: 31.9 pg (ref 26.0–34.0)
MCHC: 33.9 g/dL (ref 30.0–36.0)
MCV: 94.1 fL (ref 80.0–100.0)
Platelets: 167 10*3/uL (ref 150–400)
RBC: 4.05 MIL/uL (ref 3.87–5.11)
RDW: 13.1 % (ref 11.5–15.5)
WBC: 6.3 10*3/uL (ref 4.0–10.5)
nRBC: 0 % (ref 0.0–0.2)

## 2022-05-09 LAB — GLUCOSE, CAPILLARY
Glucose-Capillary: 113 mg/dL — ABNORMAL HIGH (ref 70–99)
Glucose-Capillary: 116 mg/dL — ABNORMAL HIGH (ref 70–99)
Glucose-Capillary: 175 mg/dL — ABNORMAL HIGH (ref 70–99)
Glucose-Capillary: 63 mg/dL — ABNORMAL LOW (ref 70–99)
Glucose-Capillary: 64 mg/dL — ABNORMAL LOW (ref 70–99)
Glucose-Capillary: 67 mg/dL — ABNORMAL LOW (ref 70–99)
Glucose-Capillary: 73 mg/dL (ref 70–99)
Glucose-Capillary: 76 mg/dL (ref 70–99)
Glucose-Capillary: 88 mg/dL (ref 70–99)

## 2022-05-09 LAB — HEMOGLOBIN A1C
Hgb A1c MFr Bld: 6.6 % — ABNORMAL HIGH (ref 4.8–5.6)
Mean Plasma Glucose: 142.72 mg/dL

## 2022-05-09 LAB — MAGNESIUM: Magnesium: 1.8 mg/dL (ref 1.7–2.4)

## 2022-05-09 LAB — PHOSPHORUS: Phosphorus: 3.5 mg/dL (ref 2.5–4.6)

## 2022-05-09 LAB — TROPONIN I (HIGH SENSITIVITY): Troponin I (High Sensitivity): 7 ng/L (ref <18)

## 2022-05-09 LAB — OSMOLALITY: Osmolality: 300 mOsm/kg — ABNORMAL HIGH (ref 275–295)

## 2022-05-09 MED ORDER — INSULIN DETEMIR 100 UNIT/ML ~~LOC~~ SOLN
15.0000 [IU] | Freq: Every day | SUBCUTANEOUS | Status: DC
  Filled 2022-05-09: qty 0.15

## 2022-05-09 MED ORDER — DIATRIZOATE MEGLUMINE & SODIUM 66-10 % PO SOLN
90.0000 mL | Freq: Once | ORAL | Status: AC
  Administered 2022-05-09: 90 mL via NASOGASTRIC
  Filled 2022-05-09: qty 90

## 2022-05-09 MED ORDER — DEXTROSE 50 % IV SOLN
12.5000 g | INTRAVENOUS | Status: AC
  Administered 2022-05-09: 12.5 g via INTRAVENOUS
  Filled 2022-05-09: qty 50

## 2022-05-09 MED ORDER — INSULIN DETEMIR 100 UNIT/ML ~~LOC~~ SOLN
12.0000 [IU] | Freq: Every day | SUBCUTANEOUS | Status: DC
  Filled 2022-05-09 (×3): qty 0.12

## 2022-05-09 MED ORDER — SODIUM CHLORIDE 0.9 % IV SOLN
6.2500 mg | Freq: Four times a day (QID) | INTRAVENOUS | Status: DC | PRN
  Administered 2022-05-09 – 2022-05-10 (×3): 6.25 mg via INTRAVENOUS
  Filled 2022-05-09 (×10): qty 0.25

## 2022-05-09 MED ORDER — POTASSIUM CHLORIDE 10 MEQ/100ML IV SOLN
10.0000 meq | INTRAVENOUS | Status: AC
  Administered 2022-05-09 (×4): 10 meq via INTRAVENOUS
  Filled 2022-05-09 (×3): qty 100

## 2022-05-09 MED ORDER — PHENOL 1.4 % MT LIQD
1.0000 | OROMUCOSAL | Status: DC | PRN
  Filled 2022-05-09: qty 177

## 2022-05-09 MED ORDER — MAGNESIUM SULFATE 2 GM/50ML IV SOLN
2.0000 g | Freq: Once | INTRAVENOUS | Status: AC
  Administered 2022-05-09: 2 g via INTRAVENOUS
  Filled 2022-05-09: qty 50

## 2022-05-09 MED ORDER — ONDANSETRON HCL 4 MG/2ML IJ SOLN
4.0000 mg | Freq: Once | INTRAMUSCULAR | Status: AC
  Administered 2022-05-09: 4 mg via INTRAVENOUS
  Filled 2022-05-09: qty 2

## 2022-05-09 MED ORDER — LACTATED RINGERS IV SOLN: INTRAVENOUS | Status: AC

## 2022-05-09 MED ORDER — INSULIN DETEMIR 100 UNIT/ML ~~LOC~~ SOLN: 6.0000 [IU] | Freq: Every day | SUBCUTANEOUS | Status: DC

## 2022-05-09 NOTE — Assessment & Plan Note (Signed)
No evidence of CP  Baseline ECG with none specific findings  Will repeat in AM or sooner if pt has any CP

## 2022-05-09 NOTE — Progress Notes (Addendum)
PROGRESS NOTE    Sara Daniels  [id number] DOB: 1945-11-15 DOA: 05/08/2022 PCP: Fanny Bien, MD     Brief Narrative:  H/o hypothyroidism , hypertension ,IDDM2 on Tresiba 36 units daily at home, history of necrotic pancreatitis s/p pancreatic debridement, presented with nausea vomiting and abdominal pain , also decreased urination, suprapubic pain, chills and fatigue   Subjective:  Had hypoglycemia in the 60's this am and pm, corrected,  Feeling better after ng placed ( clogged once, unclogged)  Daughter at bedside   Assessment & Plan:  Principal Problem:   SBO (small bowel obstruction) ([place]) Active Problems:   Controlled type 2 diabetes mellitus without complication ([place])   AKI (acute kidney injury) ([place])   UTI (urinary tract infection)   Abnormal ECG    Assessment and Plan:  SBO -NG in place -Seen by Gen surg, started on small bowel protocol with Gastrografin -Remain n.p.o., resume hydration -Management per Gen surg  Borderline hypokalemia/hypomagnesemia Replace K and mag    AKI Improved, resume IV hydration due to n.p.o. status Repeat BMP in a.m., renal dosing as  UTI Currently on Rocephin, follow urine culture   IDDM2, controlled, A1c 6.6 -Tresiba 36 units qd, 70/30 5 units qd  -Hypoglycemia event x 2 on 2/18 ,she remains is npo, decrease Levemir to 12units qhs, continue  SSI, continue hypoglycemia protocol  hypothyroidism TSH 2.2 Resume Synthroid when able to take oral  Asymptomatic gallstone Lft unremarkable, lipase WNL  .  I have Reviewed nursing notes, Vitals, pain scores, I/o's, Lab results and  imaging results since pt's last encounter, details please see discussion above  I ordered the following labs:  Unresulted Labs (From admission, onward)     Start     Ordered   05/10/22 0500  CBC with Differential/Platelet  Tomorrow morning,   R        05/09/22 0812   05/10/22 [id number]  Basic metabolic panel  Tomorrow morning,    R        05/09/22 0812   05/10/22 0500  Magnesium  Tomorrow morning,   R        05/09/22 0812   05/10/22 0500  Phosphorus  Tomorrow morning,   R        05/09/22 0812                                     DVT prophylaxis: SCDs Start: 05/08/22 2201   Code Status:   Code Status: Full Code  Family Communication: Daughter at bedside Disposition:   Dispo: The patient is from: Home, lives with husband, independent in all ADLs              Anticipated d/c is to: Home              Anticipated d/c date is: >24hrs, needs general surgery clearance  Antimicrobials:   Anti-infectives (From admission, onward)    Start     Dose/Rate Route Frequency Ordered Stop   05/09/22 2000  cefTRIAXone (ROCEPHIN) 1 g in sodium chloride 0.9 % 100 mL IVPB        1 g 200 mL/hr over 30 Minutes Intravenous Every 24 hours 05/08/22 2200     05/08/22 1830  cefTRIAXone (ROCEPHIN) 1 g in sodium chloride 0.9 % 100 mL IVPB        1 g 200 mL/hr over 30 Minutes Intravenous  Once 05/08/22 1819 05/08/22  2044           Objective: Vitals:   05/08/22 2202 05/09/22 0221 05/09/22 0502 05/09/22 1027  BP:  (!) 137/54 (!) 136/53 126/63  Pulse:  75 73 78  Resp:  18 18 16  $ Temp:  98 F (36.7 C) 98.1 F (36.7 C) 98 F (36.7 C)  TempSrc:  Oral Oral Oral  SpO2:  96% 95% 92%  Weight: 64.8 kg     Height: 4' 11.75" (1.518 m)       Intake/Output Summary (Last 24 hours) at 05/09/2022 1751 Last data filed at 05/09/2022 0400 Gross per 24 hour  Intake 1315.87 ml  Output --  Net 1315.87 ml   Filed Weights   05/08/22 2202  Weight: 64.8 kg    Examination:  General exam: Appear weak and sleepy (just received analgesic) Respiratory system: Clear to auscultation. Respiratory effort normal. Cardiovascular system:  RRR. Soft murmur left upper sternal border ( reports chronic) Gastrointestinal system: Abdomen is slightly distended, nontender.  Hypoactive bowel sounds . Central nervous system: Sleepy no focal  neurological deficits. Extremities:  no edema Skin: No rashes, lesions or ulcers Psychiatry: Judgement and insight appear normal. Mood & affect appropriate.     Data Reviewed: I have personally reviewed  labs and visualized  imaging studies since the last encounter and formulate the plan        Scheduled Meds:  insulin aspart  0-9 Units Subcutaneous Q4H   insulin detemir  12 Units Subcutaneous QHS   Continuous Infusions:  cefTRIAXone (ROCEPHIN)  IV     lactated ringers 75 mL/hr at 05/09/22 1050   promethazine (PHENERGAN) injection (IM or IVPB) 6.25 mg (05/09/22 1209)     LOS: 1 day   Time spent: 107mns  FFlorencia Reasons MD PhD FACP [facility]  Available via Epic secure chat 7am-7pm for nonurgent issues Please page for urgent issues To page the attending provider between 7A-7P or the covering provider during after hours 7P-7A, please log into the web site [email or web address] and access using universal [place] password for that web site. If you do not have the password, please call the hospital operator.    05/09/2022, 5:51 PM

## 2022-05-09 NOTE — Progress Notes (Signed)
  Transition of Care [facility]) Screening Note   Patient Details  Name: Sara Daniels Date of Birth: [birth year]   Transition of Care [facility]) CM/SW Contact:    Henrietta Dine, RN Phone Number: 05/09/2022, 10:04 AM    Transition of Care Department [facility]) has reviewed patient and no TOC needs have been identified at this time. We will continue to monitor patient advancement through interdisciplinary progression rounds. If new patient transition needs arise, please place a TOC consult.

## 2022-05-09 NOTE — Progress Notes (Signed)
Gastrografin was administered at 550am and the suction has been stopped.

## 2022-05-09 NOTE — Inpatient Diabetes Management (Signed)
Inpatient Diabetes Program Recommendations  AACE/ADA: New Consensus Statement on Inpatient Glycemic Control (2015)  Target Ranges:  Prepandial:   less than 140 mg/dL      [place]ak postprandial:   less than 180 mg/dL (1-2 hours)      Critically ill patients:  140 - 180 mg/dL   Lab Results  Component Value Date   GLUCAP 73 05/09/2022   HGBA1C 6.6 (H) 05/08/2022    Latest Reference Range & Units 05/08/22 20:26 05/08/22 22:59 05/09/22 04:59  Glucose-Capillary 70 - 99 mg/dL 120 (H) 91 73  (H): Data is abnormally high   Diabetes history: DM2 Outpatient Diabetes medications: Tresiba 46 units qd, 70/30 5 units qd Current orders for Inpatient glycemic control: Levemir 25 units q hs, Novolog 0-9 units q 4 hrs.  Inpatient Diabetes Program Recommendations:   Admitted with abdominal pain. Fasting CBG may be lower due to extended coverage time of Tresiba up to 42 hrs. Please consider: -Levemir to 20 units q hs -Decrease Novolog correction to 0-6 units q 4 hrs. While NPO.   Thank you, Nani Gasser. Donique Hammonds, RN, MSN, CDE  Diabetes Coordinator Inpatient Glycemic Control Team Team Pager [phone] (8am-5pm) 05/09/2022 7:24 AM

## 2022-05-09 NOTE — Consult Note (Signed)
Reason for Consult:ab pain Referring Physician: Dr Sanda Linger Sara Daniels is an 77 y.o. female.  HPI: 30 yof with pmh of dm, VTE, nec pancreatitis s/p debridement, appy who presents with 3-4 days of ab pain, n/v.  Did not pass any flatus/bm during that time. She has passed small amount since in hospital after ng.  This was not getting better at home which led her to come to er.  She has no fevers, never had this before.  She underwent evaluation that showed Cr of 1.51, nl wbc, ct with likely sbo.  I was asked to see her.  The ct also shows changes likely that are old related to [facility] and surgery.  She has gallstones but these are asx.   Past Medical History:  Diagnosis Date   Acute necrotizing pancreatitis 2014   Blood transfusion without reported diagnosis    Diabetes mellitus without complication ([place])    Hx of blood clots 2014   during ICU- blood clot caused cardiac arrest    Hypertension    Thyroid disease     Past Surgical History:  Procedure Laterality Date   APPENDECTOMY  2007   PANCREAS SURGERY  06/2013   necrosis area removed-AT [facility]-pt has 10% working pancreas     Family History  Problem Relation Age of Onset   Ovarian cancer Mother    Pancreatic cancer Father    Colon cancer Neg Hx    Esophageal cancer Neg Hx    Stomach cancer Neg Hx    Rectal cancer Neg Hx     Social History:  reports that she has never smoked. She has never used smokeless tobacco. She reports that she does not currently use alcohol. She reports that she does not use drugs.  Allergies: No Known Allergies  No current facility-administered medications on file prior to encounter.   Current Outpatient Medications on File Prior to Encounter  Medication Sig Dispense Refill   Blood Glucose Monitoring Suppl ([place]) DEVI by [place].(Non-Drug; Combo Route) route. daily     Cholecalciferol 5000 units capsule Take 1 tablet by mouth daily.     glucose blood (ACCU-CHEK  ACTIVE STRIPS) test strip by [place].(Non-Drug; Combo Route) route.     insulin aspart protamine - aspart (NOVOLOG 70/30 MIX) (70-30) 100 UNIT/ML FlexPen Inject 5 Units into the skin daily.      Insulin Pen Needle (BD PEN NEEDLE NANO U/F) 32G X 4 MM MISC      levothyroxine (SYNTHROID, LEVOTHROID) 75 MCG tablet Take 1 tablet by mouth daily.  1   Na Sulfate-K Sulfate-Mg Sulf 17.5-3.13-1.6 GM/177ML SOLN Suprep (no substitutions)-TAKE AS DIRECTED. 354 mL 0   olmesartan (BENICAR) 20 MG tablet Take 20 mg by mouth daily.  0   TRESIBA FLEXTOUCH 200 UNIT/ML SOPN Inject 46 Units into the skin daily.   1     Results for orders placed or performed during the hospital encounter of 05/08/22 (from the past 48 hour(s))  Lipase, blood     Status: None   Collection Time: 05/08/22  4:01 PM  Result Value Ref Range   Lipase 13 11 - 51 U/L    Comment: Performed at [facility], [address], [place],  [address]  Comprehensive metabolic panel     Status: Abnormal   Collection Time: 05/08/22  4:01 PM  Result Value Ref Range   Sodium 135 135 - 145 mmol/L   Potassium 4.3 3.5 - 5.1 mmol/L   Chloride 97 (  L) 98 - 111 mmol/L   CO2 25 22 - 32 mmol/L   Glucose, Bld 191 (H) 70 - 99 mg/dL    Comment: Glucose reference range applies only to samples taken after fasting for at least 8 hours.   BUN 38 (H) 8 - 23 mg/dL   Creatinine, Ser 1.51 (H) 0.44 - 1.00 mg/dL   Calcium 10.3 8.9 - 10.3 mg/dL   Total Protein 8.0 6.5 - 8.1 g/dL   Albumin 5.0 3.5 - 5.0 g/dL   AST 23 15 - 41 U/L   ALT 25 0 - 44 U/L   Alkaline Phosphatase 67 38 - 126 U/L   Total Bilirubin 1.2 0.3 - 1.2 mg/dL   GFR, Estimated 35 (L) >60 mL/min    Comment: (NOTE) Calculated using the CKD-EPI Creatinine Equation (2021)    Anion gap 13 5 - 15    Comment: Performed at [facility], [address] [place], [place] [address]  CBC     Status: None   Collection Time: 05/08/22  4:01 PM  Result Value Ref Range    WBC 10.5 4.0 - 10.5 K/uL   RBC 4.69 3.87 - 5.11 MIL/uL   Hemoglobin 14.6 12.0 - 15.0 g/dL   HCT 42.2 36.0 - 46.0 %   MCV 90.0 80.0 - 100.0 fL   MCH 31.1 26.0 - 34.0 pg   MCHC 34.6 30.0 - 36.0 g/dL   RDW 13.0 11.5 - 15.5 %   Platelets 213 150 - 400 K/uL   nRBC 0.0 0.0 - 0.2 %    Comment: Performed at [facility], [address], [place], [place] [address]  Urinalysis, Routine w reflex microscopic -Urine, Clean Catch     Status: Abnormal   Collection Time: 05/08/22  4:01 PM  Result Value Ref Range   Color, Urine YELLOW YELLOW   APPearance HAZY (A) CLEAR   Specific Gravity, Urine 1.020 1.005 - 1.030   pH 5.0 5.0 - 8.0   Glucose, UA NEGATIVE NEGATIVE mg/dL   Hgb urine dipstick NEGATIVE NEGATIVE   Bilirubin Urine NEGATIVE NEGATIVE   Ketones, ur NEGATIVE NEGATIVE mg/dL   Protein, ur TRACE (A) NEGATIVE mg/dL   Nitrite NEGATIVE NEGATIVE   Leukocytes,Ua MODERATE (A) NEGATIVE   RBC / HPF 0-5 0 - 5 RBC/hpf   WBC, UA >50 0 - 5 WBC/hpf   Bacteria, UA MANY (A) NONE SEEN   Squamous Epithelial / HPF 0-5 0 - 5 /HPF   Hyaline Casts, UA PRESENT     Comment: Performed at [facility], [address] [place], [place] [address]  Magnesium     Status: None   Collection Time: 05/08/22  4:01 PM  Result Value Ref Range   Magnesium 2.0 1.7 - 2.4 mg/dL    Comment: Performed at [facility], [address] [place], [place] [address]  Urinalysis, w/ Reflex to Culture (Infection Suspected) -Urine, Clean Catch     Status: Abnormal   Collection Time: 05/08/22  6:02 PM  Result Value Ref Range   Specimen Source URINE, CLEAN CATCH    Color, Urine YELLOW YELLOW   APPearance CLEAR CLEAR   Specific Gravity, Urine >1.046 (H) 1.005 - 1.030   pH 6.5 5.0 - 8.0   Glucose, UA NEGATIVE NEGATIVE mg/dL   Hgb urine dipstick NEGATIVE NEGATIVE   Bilirubin Urine NEGATIVE NEGATIVE   Ketones, ur NEGATIVE NEGATIVE mg/dL   Protein, ur TRACE (A) NEGATIVE mg/dL    Nitrite NEGATIVE NEGATIVE  Leukocytes,Ua SMALL (A) NEGATIVE   RBC / HPF 0-5 0 - 5 RBC/hpf   WBC, UA 21-50 0 - 5 WBC/hpf    Comment:        Reflex urine culture not performed if WBC <=10, OR if Squamous epithelial cells >5. If Squamous epithelial cells >5 suggest recollection.    Bacteria, UA RARE (A) NONE SEEN   Squamous Epithelial / HPF 0-5 0 - 5 /HPF   Hyaline Casts, UA PRESENT     Comment: Performed at [facility], [address], [place], [place] [address]  CBG monitoring, ED     Status: Abnormal   Collection Time: 05/08/22  8:26 PM  Result Value Ref Range   Glucose-Capillary 120 (H) 70 - 99 mg/dL    Comment: Glucose reference range applies only to samples taken after fasting for at least 8 hours.  Hemoglobin A1c     Status: Abnormal   Collection Time: 05/08/22 10:20 PM  Result Value Ref Range   Hgb A1c MFr Bld 6.6 (H) 4.8 - 5.6 %    Comment: (NOTE) Pre diabetes:          5.7%-6.4%  Diabetes:              >6.4%  Glycemic control for   <7.0% adults with diabetes    Mean Plasma Glucose 142.72 mg/dL    Comment: Performed at [facility] [address], [place], [place] [address]  Osmolality     Status: Abnormal   Collection Time: 05/08/22 10:20 PM  Result Value Ref Range   Osmolality 300 (H) 275 - 295 mOsm/kg    Comment: PERFORMED AT [facility] Performed at [facility], [place] [address] [place], [place] [address]   Phosphorus     Status: None   Collection Time: 05/08/22 10:20 PM  Result Value Ref Range   Phosphorus 3.6 2.5 - 4.6 mg/dL    Comment: Performed at [facility], [place] [address], [place], [place] [address]  TSH     Status: None   Collection Time: 05/08/22 10:20 PM  Result Value Ref Range   TSH 2.215 0.350 - 4.500 uIU/mL    Comment: Performed by a 3rd Generation assay with a functional sensitivity of <=0.01 uIU/mL. Performed at [facility], [place] [address] [place], [place]  [address]   Troponin I (High Sensitivity)     Status: None   Collection Time: 05/08/22 10:20 PM  Result Value Ref Range   Troponin I (High Sensitivity) 7 <18 ng/L    Comment: (NOTE) Elevated high sensitivity troponin I (hsTnI) values and significant  changes across serial measurements may suggest ACS but many other  chronic and acute conditions are known to elevate hsTnI results.  Refer to the "Links" section for chest pain algorithms and additional  guidance. Performed at [facility], [place] [address], [place], [place] [address]   Glucose, capillary     Status: None   Collection Time: 05/08/22 10:59 PM  Result Value Ref Range   Glucose-Capillary 91 70 - 99 mg/dL    Comment: Glucose reference range applies only to samples taken after fasting for at least 8 hours.    DG Chest Portable 1 View  Result Date: 05/08/2022 CLINICAL DATA:  NG placement EXAM: PORTABLE CHEST - 1 VIEW COMPARISON:  None Available. FINDINGS: Cardiac silhouette is unremarkable. No pneumothorax or pleural effusion. The lungs are clear. Aorta is calcified. The visualized skeletal structures are  unremarkable. NG tube tip is below the diaphragm in the region of the stomach. IMPRESSION: No acute cardiopulmonary process.  NGT appropriately positioned. Electronically Signed   By: Sammie Bench M.D.   On: 05/08/2022 20:02   CT ABDOMEN PELVIS W CONTRAST  Addendum Date: 05/08/2022   ADDENDUM REPORT: 05/08/2022 17:58 ADDENDUM: As mentioned in the body of the report, there is a small amount of gas non dependently in the lumen of the urinary bladder. This may be iatrogenic in the setting of recent bladder catheterization. In the absence of recent history of catheterization, correlation with urinalysis would be recommended to exclude the possibility of urinary tract infection with gas-forming organisms. Electronically Signed   By: Vinnie Langton M.D.   On: 05/08/2022 17:58   Result Date: 05/08/2022 CLINICAL DATA:   77 year old female with abdominal pain, nausea and vomiting. Concern for potential small bowel obstruction. EXAM: CT ABDOMEN AND PELVIS WITH CONTRAST TECHNIQUE: Multidetector CT imaging of the abdomen and pelvis was performed using the standard protocol following bolus administration of intravenous contrast. RADIATION DOSE REDUCTION: This exam was performed according to the departmental dose-optimization program which includes automated exposure control, adjustment of the mA and/or kV according to patient size and/or use of iterative reconstruction technique. CONTRAST:  59m OMNIPAQUE IOHEXOL 300 MG/ML  SOLN COMPARISON:  No priors. FINDINGS: Lower chest: Widespread scarring in the lung bases bilaterally. Atherosclerotic calcifications in the descending thoracic aorta as well as the left anterior descending coronary artery. Hepatobiliary: Diffuse low attenuation throughout the hepatic parenchyma, indicative of a background of hepatic steatosis. No discrete cystic or solid hepatic lesions. No intra or extrahepatic biliary ductal dilatation. Multiple calcified gallstones lying dependently in the gallbladder. There is also a large volume of intermediate attenuation fluid in the gallbladder, presumably biliary sludge. Gallbladder is moderately distended. Gallbladder wall thickness appears normal. No pericholecystic fluid or surrounding inflammatory changes to indicate an acute cholecystitis at this time. Pancreas: Atrophy throughout the body and tail of the pancreas. In the region of the pancreatic tail there is a well-defined 3.8 x 3.3 cm low-attenuation lesion, most likely to represent a pancreatic pseudocyst. No solid pancreatic mass. No pancreatic ductal dilatation. No pancreatic or peripancreatic fluid collections or inflammatory changes. Spleen: Unremarkable. Adrenals/Urinary Tract: Right kidney and bilateral adrenal glands are normal in appearance. Mild atrophy of the left kidney where there is also multifocal  cortical thinning, likely to reflect chronic post infectious or inflammatory scarring. No hydroureteronephrosis. Urinary bladder is nearly completely decompressed. Small amount of gas non dependently in the urinary bladder is noted. Stomach/Bowel: Numerous varices are noted in the proximal aspect of the stomach. Stomach is otherwise grossly unremarkable in appearance. There are multiple dilated loops of mid small bowel, which appear to represent distal jejunum and proximal ileum. Distal small bowel is completely decompressed, as is the colon. Small bowel loops measure up to 5.6 cm in diameter, with multiple air-fluid levels, and some bowel loops demonstrating internal feculent contents, suggesting bacterial overgrowth. An exact transition point is not confidently identified on today's CT examination. Numerous colonic diverticula are noted, particularly in the sigmoid colon, without focal surrounding inflammatory changes to indicate an acute diverticulitis at this time. The appendix is not confidently identified and may be surgically absent. Regardless, there are no inflammatory changes noted adjacent to the cecum to suggest the presence of an acute appendicitis at this time. Vascular/Lymphatic: Atherosclerotic calcifications are noted in the abdominal aorta and pelvic vasculature, without evidence of aneurysm or dissection. Splenic vein is patent,  but diminutive in size with numerous portosystemic collateral pathways, including large gastric varices. No lymphadenopathy noted in the abdomen or pelvis. Reproductive: Uterus and ovaries are unremarkable in appearance. Other: No significant volume of ascites.  No pneumoperitoneum. Musculoskeletal: There are no aggressive appearing lytic or blastic lesions noted in the visualized portions of the skeleton. IMPRESSION: 1. Findings are compatible with severe small-bowel obstruction, as detailed above. An exact transition point is not identified, however, the portions of the  small bowel that appear dilated are the distal jejunum and proximal ileum. Surgical consultation is recommended. 2. Atrophy of the body and tail of the pancreas associated with narrowed splenic vein and portosystemic collateral pathways, including large gastric varices, as above. This may relate to prior episodes of pancreatitis. Associated with this, there is a well-defined benign-appearing cystic lesion in the region of the tail of the pancreas, presumably a chronic pancreatic pseudocyst or other benign lesion. 3. Cholelithiasis and biliary sludge in the gallbladder without evidence of acute cholecystitis. 4. Colonic diverticulosis without evidence of acute diverticulitis at this time. 5. Aortic atherosclerosis. 6. Additional incidental findings, as above. Electronically Signed: By: Vinnie Langton M.D. On: 05/08/2022 17:44    Review of Systems  Gastrointestinal:  Positive for abdominal pain, constipation, nausea and vomiting.  All other systems reviewed and are negative.  Blood pressure (!) 137/54, pulse 75, temperature 98 F (36.7 C), temperature source Oral, resp. rate 18, height 4' 11.75" (1.518 m), weight 64.8 kg, SpO2 96 %. Physical Exam Vitals reviewed.  Constitutional:      Appearance: She is well-developed.  Eyes:     General: No scleral icterus. Cardiovascular:     Rate and Rhythm: Normal rate and regular rhythm.  Pulmonary:     Effort: Pulmonary effort is normal.  Abdominal:     General: There is distension (very mild).     Palpations: Abdomen is soft.     Tenderness: There is no abdominal tenderness.     Hernia: No hernia is present.  Skin:    General: Skin is warm and dry.     Capillary Refill: Capillary refill takes less than 2 seconds.  Neurological:     General: No focal deficit present.     Mental Status: She is alert.  Psychiatric:        Mood and Affect: Mood normal.        Behavior: Behavior normal.     Assessment/Plan: SBO -she doesn't have any indication  for surgery at this time -she has actually had some flatus since admission and feels better with ng -agree with hydration, ng drainage -will start gg protocol today -discussed that likely will resolve conservatively, indications for surgery being not getting better or worsening -pharm dvt prophylaxis fine  I reviewed ED provider notes, hospitalist notes, last 24 h vitals and pain scores, last 24 h labs and trends, and last 24 h imaging results. Discussed case with medical team, reviewed ct showing sbo.  This care required moderate level of medical decision making.    Rolm Bookbinder 05/09/2022, 4:52 AM

## 2022-05-10 ENCOUNTER — Inpatient Hospital Stay (HOSPITAL_COMMUNITY): Payer: Medicare HMO

## 2022-05-10 DIAGNOSIS — E119 Type 2 diabetes mellitus without complications: Secondary | ICD-10-CM | POA: Diagnosis not present

## 2022-05-10 DIAGNOSIS — R9431 Abnormal electrocardiogram [ECG] [EKG]: Secondary | ICD-10-CM | POA: Diagnosis not present

## 2022-05-10 DIAGNOSIS — K56609 Unspecified intestinal obstruction, unspecified as to partial versus complete obstruction: Secondary | ICD-10-CM | POA: Diagnosis not present

## 2022-05-10 DIAGNOSIS — N179 Acute kidney failure, unspecified: Secondary | ICD-10-CM | POA: Diagnosis not present

## 2022-05-10 LAB — CBC WITH DIFFERENTIAL/PLATELET
Abs Immature Granulocytes: 0.01 10*3/uL (ref 0.00–0.07)
Basophils Absolute: 0 10*3/uL (ref 0.0–0.1)
Basophils Relative: 0 %
Eosinophils Absolute: 0 10*3/uL (ref 0.0–0.5)
Eosinophils Relative: 0 %
HCT: 38.2 % (ref 36.0–46.0)
Hemoglobin: 12.5 g/dL (ref 12.0–15.0)
Immature Granulocytes: 0 %
Lymphocytes Relative: 9 %
Lymphs Abs: 0.5 10*3/uL — ABNORMAL LOW (ref 0.7–4.0)
MCH: 31.3 pg (ref 26.0–34.0)
MCHC: 32.7 g/dL (ref 30.0–36.0)
MCV: 95.7 fL (ref 80.0–100.0)
Monocytes Absolute: 0.6 10*3/uL (ref 0.1–1.0)
Monocytes Relative: 10 %
Neutro Abs: 5 10*3/uL (ref 1.7–7.7)
Neutrophils Relative %: 81 %
Platelets: 155 10*3/uL (ref 150–400)
RBC: 3.99 MIL/uL (ref 3.87–5.11)
RDW: 13.2 % (ref 11.5–15.5)
WBC: 6.2 10*3/uL (ref 4.0–10.5)
nRBC: 0 % (ref 0.0–0.2)

## 2022-05-10 LAB — GLUCOSE, CAPILLARY
Glucose-Capillary: 104 mg/dL — ABNORMAL HIGH (ref 70–99)
Glucose-Capillary: 96 mg/dL (ref 70–99)
Glucose-Capillary: 84 mg/dL (ref 70–99)
Glucose-Capillary: 59 mg/dL — ABNORMAL LOW (ref 70–99)
Glucose-Capillary: 132 mg/dL — ABNORMAL HIGH (ref 70–99)
Glucose-Capillary: 106 mg/dL — ABNORMAL HIGH (ref 70–99)

## 2022-05-10 LAB — PHOSPHORUS: Phosphorus: 4.1 mg/dL (ref 2.5–4.6)

## 2022-05-10 LAB — BASIC METABOLIC PANEL
Anion gap: 13 (ref 5–15)
BUN: 33 mg/dL — ABNORMAL HIGH (ref 8–23)
CO2: 27 mmol/L (ref 22–32)
Calcium: 9.1 mg/dL (ref 8.9–10.3)
Chloride: 100 mmol/L (ref 98–111)
Creatinine, Ser: 1.21 mg/dL — ABNORMAL HIGH (ref 0.44–1.00)
GFR, Estimated: 46 mL/min — ABNORMAL LOW (ref >60)
Glucose, Bld: 106 mg/dL — ABNORMAL HIGH (ref 70–99)
Potassium: 3.9 mmol/L (ref 3.5–5.1)
Sodium: 140 mmol/L (ref 135–145)

## 2022-05-10 LAB — MAGNESIUM: Magnesium: 2.7 mg/dL — ABNORMAL HIGH (ref 1.7–2.4)

## 2022-05-10 MED ORDER — PROCHLORPERAZINE EDISYLATE 10 MG/2ML IJ SOLN
5.0000 mg | Freq: Once | INTRAMUSCULAR | Status: AC | PRN
  Administered 2022-05-10: 5 mg via INTRAVENOUS
  Filled 2022-05-10: qty 2

## 2022-05-10 MED ORDER — DEXTROSE 50 % IV SOLN
12.5000 g | INTRAVENOUS | Status: AC
  Administered 2022-05-10: 12.5 g via INTRAVENOUS

## 2022-05-10 NOTE — Progress Notes (Addendum)
Patient had profound nausea through the night. Paged floor coverage NP for an additional anti-emetic that helped. NGT remains on LIS. Daughter stayed with patient through the night for support. One hypoglycemic episode treated with D50 12.5 grams.

## 2022-05-10 NOTE — Progress Notes (Signed)
PROGRESS NOTE    Sara Daniels  [id number] DOB: Mar 05, 1946 DOA: 05/08/2022 PCP: Fanny Bien, MD     Brief Narrative:  Patient is a 77 years old female with past medical history of diabetes mellitus type 2, hypertension, hypothyroidism history of necrotic pancreatitis s/p pancreatic debridement, presented to the hospital with nausea vomiting and abdominal pain with decreased urination and suprapubic pain, fatigue and chills.  In ED vitals were stable.  Patient did not have leukocytosis.  UA showed than 50 white cells.  Creatinine was elevated at 1.5.  Lipase was 13.  LFTs were within normal range.  CT scan of the abdomen pelvis done on 05/08/2022 showed findings compatible with severe small bowel obstruction.  Atrophy of the body and tail of pancreas was noted.  Patient was then considered for admission to hospital for further evaluation and treatment.   Assessment & Plan:  Principal Problem:   SBO (small bowel obstruction) (HCC) Active Problems:   Controlled type 2 diabetes mellitus without complication (HCC)   AKI (acute kidney injury) ([place])   UTI (urinary tract infection)   Abnormal ECG   Assessment and Plan:  Small bowel obstruction. Seen by general surgery.  Continue IV fluids n.p.o. with NG tube.,  Bowel sounds starting to return.  Continue conservative treatment.  General surgery following.  Overall improving.  Likely NG tube clamping in AM.  Borderline hypokalemia/hypomagnesemia Improved after replacement.  Check levels in AM.  Acute kidney injury Improved.  Latest creatinine of 1.2.  Gram-negative UTI Continue Rocephin, urine culture with more than 100,000 gram-negative rods.  Diabetes melitis type II.  Insulin-dependent.  Latest hemoglobin A1c of 6.6.  On Tresiba and 70/30 insulin.  Currently on Levemir and sliding scale insulin with hypoglycemia   hypothyroidism TSH 2.2 resume Synthroid when p.o. okay.  Asymptomatic gallstone Lipase and LFTs  within normal range.  DVT prophylaxis: SCDs Start: 05/08/22 2201   Code Status:   Code Status: Full Code  Family Communication:  Spoke with the patient's son at bedside.  Disposition:   Dispo: The patient is from: Home, lives with husband, independent in all ADLs              Anticipated d/c is to: Home              Anticipated d/c date is: 1 to 2 days, will need general surgery clearance.  Antimicrobials:   Anti-infectives (From admission, onward)    Start     Dose/Rate Route Frequency Ordered Stop   05/09/22 2000  cefTRIAXone (ROCEPHIN) 1 g in sodium chloride 0.9 % 100 mL IVPB        1 g 200 mL/hr over 30 Minutes Intravenous Every 24 hours 05/08/22 2200     05/08/22 1830  cefTRIAXone (ROCEPHIN) 1 g in sodium chloride 0.9 % 100 mL IVPB        1 g 200 mL/hr over 30 Minutes Intravenous  Once 05/08/22 1819 05/08/22 2044      Subjective:  Today patient was seen and examined at bedside.  Patient denies any nausea  abdominal pain.  Had 1 episode of vomiting this morning.  Passing gas.  She also has had a bowel movement.  Feels better than when she came in.     Objective: Vitals:   05/09/22 0502 05/09/22 1027 05/09/22 1948 05/10/22 0411  BP: (!) 136/53 126/63 (!) 137/55 (!) 149/58  Pulse: 73 78 78 72  Resp: 18 16 17 18  $ Temp: 98.1 F (36.7 C)  98 F (36.7 C) 97.9 F (36.6 C) 98 F (36.7 C)  TempSrc: Oral Oral Oral Oral  SpO2: 95% 92% 93% 95%  Weight:      Height:        Intake/Output Summary (Last 24 hours) at 05/10/2022 1144 Last data filed at 05/10/2022 0600 Gross per 24 hour  Intake 1589.65 ml  Output 450 ml  Net 1139.65 ml    Filed Weights   05/08/22 2202  Weight: 64.8 kg    Physical examination: Body mass index is 28.13 kg/m.  General:  Average built, not in obvious distress elderly female, NG tube in place HENT:   No scleral pallor or icterus noted. Oral mucosa is moist.  NG tube in place Chest:  Clear breath sounds.   No crackles or wheezes.  CVS:  S1 &S2 heard.  Murmur noted..  Regular rate and rhythm. Abdomen: Soft, nontender, mildly distended.  Bowel sounds are heard but hypoactive.   Extremities: No cyanosis, clubbing or edema.  Peripheral pulses are palpable. Psych: Alert, awake and oriented, normal mood CNS:  No cranial nerve deficits.  Power equal in all extremities.   Skin: Warm and dry.  No rashes noted.   Data Reviewed: I have personally reviewed  labs and visualized  imaging studies   Scheduled Meds:  insulin aspart  0-9 Units Subcutaneous Q4H   insulin detemir  12 Units Subcutaneous QHS   Continuous Infusions:  cefTRIAXone (ROCEPHIN)  IV Stopped (05/09/22 2053)   promethazine (PHENERGAN) injection (IM or IVPB) Stopped (05/10/22 0535)     LOS: 2 days    Flora Lipps, MD  [facility] 05/10/2022, 11:44 AM

## 2022-05-10 NOTE — Progress Notes (Signed)
Subjective: CC: RN reports NGT was advanced yesterday per rads recommended. She threw up around ngt around 430 am this am. RN reports NGT had to get flushed. Patient with feculent appearing output in tubing with minimal output in cannister but NGT appears to be functioning now. Just before I came in she reports she had a liquid bm and is feeling better now. When she presented she had generalized abdominal pain - this has resolved and she now just has soreness of her upper abdomen that she thinks is from throwing up. No nausea currently. Reports hx of appendectomy in 2007 and video assisted retroperitoneal debridement in 2015 (see below).   Afebrile. No tachycardia or hypotension. WBC wnl at 6.2.   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 Also had Splenic vein thrombus. Her acute peripancreatic fluid collections were managed w/ IR perc drainage. Underwent video assisted retroperitoneal debridement in 2015. Etiology of necrotizing pancreatitis was not clear per notes.  [facility] with [facility], Dr. Carlis Abbott. Last saw in 2017. At that time they discussed possible cholecystectomy but US showed no evidence of cholelithiasis. Last CT 2015, prior to the one on admission, showed "Residual enhancing pancreatic parenchyma is noted in the head, unchanged. There is a collection in the tail region measuring 3.8 x 0.9 cm, 4.7 x 1.6 cm. This collection tracks down into the anterior pararenal space and into the upper pelvis. No gas within the collection. Thickening of the root of mesentery is noted [...] Splenic vein is thrombosed, with extensive collaterals"  Objective: Vital signs in last 24 hours: Temp:  [97.9 F (36.6 C)-98 F (36.7 C)] 98 F (36.7 C) (02/19 0411) Pulse Rate:  [72-78] 72 (02/19 0411) Resp:  [16-18] 18 (02/19 0411) BP:  (126-149)/(55-63) 149/58 (02/19 0411) SpO2:  [92 %-95 %] 95 % (02/19 0411) Last BM Date :  (PTA)  Intake/Output from previous day: 02/18 0701 - 02/19 0700 In: 1589.7 [I.V.:1339.7; IV Piggyback:250] Out: 450 [Emesis/NG output:450] Intake/Output this shift: No intake/output data recorded.  PE: Gen:  Alert, NAD, pleasant Abd: Mild distension but soft. No real ttp today. No rigidity or guarding. Some BS but still hypoactive. NGT in place.  Psych: A&Ox3   Lab Results:  Recent Labs    05/09/22 0645 05/10/22 0452  WBC 6.3 6.2  HGB 12.9 12.5  HCT 38.1 38.2  PLT 167 155   BMET Recent Labs    05/09/22 0645 05/10/22 0452  NA 137 140  K 3.8 3.9  CL 105 100  CO2 21* 27  GLUCOSE 81 106*  BUN 33* 33*  CREATININE 1.03* 1.21*  CALCIUM 8.6* 9.1   PT/INR No results for input(s): "LABPROT", "INR" in the last 72 hours. CMP     Component Value Date/Time   NA 140 05/10/2022 0452   K 3.9 05/10/2022 0452   CL 100 05/10/2022 0452   CO2 27 05/10/2022 0452   GLUCOSE 106 (H) 05/10/2022 0452   BUN 33 (H) 05/10/2022 0452   CREATININE 1.21 (H) 05/10/2022 0452   CALCIUM 9.1 05/10/2022 0452   PROT 6.6 05/09/2022 0645   ALBUMIN 3.6 05/09/2022 0645   AST 27 05/09/2022 0645   ALT 29 05/09/2022 0645   ALKPHOS 55 05/09/2022 0645   BILITOT 0.8 05/09/2022 0645   GFRNONAA 46 (L) 05/10/2022 0452   Lipase     Component Value  Date/Time   LIPASE 13 05/08/2022 1601    Studies/Results: DG Abd Portable 1V-Small Bowel Obstruction Protocol-initial, 8 hr delay  Result Date: 05/09/2022 CLINICAL DATA:  77 year old female with history of small-bowel obstruction. EXAM: PORTABLE ABDOMEN - 1 VIEW COMPARISON:  Abdominal radiograph 05/09/2022. FINDINGS: Nasogastric tube again noted, with side port just distal to the gastroesophageal junction and tip in the distal body of the stomach. Multiple dilated loops of small bowel are again noted in the central abdomen, largest of which is slightly to the right of  midline measuring up to 5.6 cm in diameter. There is a small amount of gas and stool noted throughout the colon. No definite pneumoperitoneum confidently identified on these supine images. Iodinated contrast material noted in the lumen of the urinary bladder. IMPRESSION: 1. Support apparatus, as above. 2. Persistently abnormal bowel-gas pattern with imaging findings concerning for persistent small bowel obstruction, likely partial. Electronically Signed   By: Vinnie Langton M.D.   On: 05/09/2022 17:08   DG Abd 1 View  Result Date: 05/09/2022 CLINICAL DATA:  Small bowel obstruction EXAM: ABDOMEN - 1 VIEW COMPARISON:  CT 05/08/2022, plain film 05/08/2022 FINDINGS: NG tube in stomach. The side-port has migrated several cm above the diaphragm. Consider advancing NG tube approximately 5 cm such that the side port below the GE junction. Dilated loops of small bowel again demonstrated primarily in the RIGHT abdomen. Loops measure up to 4.4 cm. There is gas in the rectum. No intraperitoneal free air identified. IMPRESSION: 1. Recommend advancing NG tube 5 cm as  above. 2. Persistent dilated loops of small bowel primarily in the RIGHT abdomen. No interval improvement from CT 1 day prior. 3. Small amount gas the rectum. These results will be called to the ordering clinician or representative by the Radiologist Assistant, and communication documented in the PACS or [employer]. Electronically Signed   By: Suzy Bouchard M.D.   On: 05/09/2022 12:50   DG Chest Portable 1 View  Result Date: 05/08/2022 CLINICAL DATA:  NG placement EXAM: PORTABLE CHEST - 1 VIEW COMPARISON:  None Available. FINDINGS: Cardiac silhouette is unremarkable. No pneumothorax or pleural effusion. The lungs are clear. Aorta is calcified. The visualized skeletal structures are unremarkable. NG tube tip is below the diaphragm in the region of the stomach. IMPRESSION: No acute cardiopulmonary process.  NGT appropriately positioned.  Electronically Signed   By: Sammie Bench M.D.   On: 05/08/2022 20:02   CT ABDOMEN PELVIS W CONTRAST  Addendum Date: 05/08/2022   ADDENDUM REPORT: 05/08/2022 17:58 ADDENDUM: As mentioned in the body of the report, there is a small amount of gas non dependently in the lumen of the urinary bladder. This may be iatrogenic in the setting of recent bladder catheterization. In the absence of recent history of catheterization, correlation with urinalysis would be recommended to exclude the possibility of urinary tract infection with gas-forming organisms. Electronically Signed   By: Vinnie Langton M.D.   On: 05/08/2022 17:58   Result Date: 05/08/2022 CLINICAL DATA:  77 year old female with abdominal pain, nausea and vomiting. Concern for potential small bowel obstruction. EXAM: CT ABDOMEN AND PELVIS WITH CONTRAST TECHNIQUE: Multidetector CT imaging of the abdomen and pelvis was performed using the standard protocol following bolus administration of intravenous contrast. RADIATION DOSE REDUCTION: This exam was performed according to the departmental dose-optimization program which includes automated exposure control, adjustment of the mA and/or kV according to patient size and/or use of iterative reconstruction technique. CONTRAST:  29m OMNIPAQUE IOHEXOL 300 MG/ML  SOLN COMPARISON:  No priors. FINDINGS: Lower chest: Widespread scarring in the lung bases bilaterally. Atherosclerotic calcifications in the descending thoracic aorta as well as the left anterior descending coronary artery. Hepatobiliary: Diffuse low attenuation throughout the hepatic parenchyma, indicative of a background of hepatic steatosis. No discrete cystic or solid hepatic lesions. No intra or extrahepatic biliary ductal dilatation. Multiple calcified gallstones lying dependently in the gallbladder. There is also a large volume of intermediate attenuation fluid in the gallbladder, presumably biliary sludge. Gallbladder is moderately distended.  Gallbladder wall thickness appears normal. No pericholecystic fluid or surrounding inflammatory changes to indicate an acute cholecystitis at this time. Pancreas: Atrophy throughout the body and tail of the pancreas. In the region of the pancreatic tail there is a well-defined 3.8 x 3.3 cm low-attenuation lesion, most likely to represent a pancreatic pseudocyst. No solid pancreatic mass. No pancreatic ductal dilatation. No pancreatic or peripancreatic fluid collections or inflammatory changes. Spleen: Unremarkable. Adrenals/Urinary Tract: Right kidney and bilateral adrenal glands are normal in appearance. Mild atrophy of the left kidney where there is also multifocal cortical thinning, likely to reflect chronic post infectious or inflammatory scarring. No hydroureteronephrosis. Urinary bladder is nearly completely decompressed. Small amount of gas non dependently in the urinary bladder is noted. Stomach/Bowel: Numerous varices are noted in the proximal aspect of the stomach. Stomach is otherwise grossly unremarkable in appearance. There are multiple dilated loops of mid small bowel, which appear to represent distal jejunum and proximal ileum. Distal small bowel is completely decompressed, as is the colon. Small bowel loops measure up to 5.6 cm in diameter, with multiple air-fluid levels, and some bowel loops demonstrating internal feculent contents, suggesting bacterial overgrowth. An exact transition point is not confidently identified on today's CT examination. Numerous colonic diverticula are noted, particularly in the sigmoid colon, without focal surrounding inflammatory changes to indicate an acute diverticulitis at this time. The appendix is not confidently identified and may be surgically absent. Regardless, there are no inflammatory changes noted adjacent to the cecum to suggest the presence of an acute appendicitis at this time. Vascular/Lymphatic: Atherosclerotic calcifications are noted in the abdominal  aorta and pelvic vasculature, without evidence of aneurysm or dissection. Splenic vein is patent, but diminutive in size with numerous portosystemic collateral pathways, including large gastric varices. No lymphadenopathy noted in the abdomen or pelvis. Reproductive: Uterus and ovaries are unremarkable in appearance. Other: No significant volume of ascites.  No pneumoperitoneum. Musculoskeletal: There are no aggressive appearing lytic or blastic lesions noted in the visualized portions of the skeleton. IMPRESSION: 1. Findings are compatible with severe small-bowel obstruction, as detailed above. An exact transition point is not identified, however, the portions of the small bowel that appear dilated are the distal jejunum and proximal ileum. Surgical consultation is recommended. 2. Atrophy of the body and tail of the pancreas associated with narrowed splenic vein and portosystemic collateral pathways, including large gastric varices, as above. This may relate to prior episodes of pancreatitis. Associated with this, there is a well-defined benign-appearing cystic lesion in the region of the tail of the pancreas, presumably a chronic pancreatic pseudocyst or other benign lesion. 3. Cholelithiasis and biliary sludge in the gallbladder without evidence of acute cholecystitis. 4. Colonic diverticulosis without evidence of acute diverticulitis at this time. 5. Aortic atherosclerosis. 6. Additional incidental findings, as above. Electronically Signed: By: Vinnie Langton M.D. On: 05/08/2022 17:44    Anti-infectives: Anti-infectives (From admission, onward)    Start     Dose/Rate Route Frequency Ordered Stop  05/09/22 2000  cefTRIAXone (ROCEPHIN) 1 g in sodium chloride 0.9 % 100 mL IVPB        1 g 200 mL/hr over 30 Minutes Intravenous Every 24 hours 05/08/22 2200     05/08/22 1830  cefTRIAXone (ROCEPHIN) 1 g in sodium chloride 0.9 % 100 mL IVPB        1 g 200 mL/hr over 30 Minutes Intravenous  Once 05/08/22  1819 05/08/22 2044        Assessment/Plan SBO - CT with evidence of SBO without exact transition point identified. She has hx of of appendectomy in 2007 and video assisted retroperitoneal debridement in 2015 - Afebrile.  No tachycardia or hypotension.  WBC wnl. No peritonitis on exam. No indication for emergency surgery.  - Patient with some return of bowel function but was vomiting around NGT this am with persistent sbo like changes on xray. Will leave on LIWS today. Repeat film in am.  - Keep K > 4 and Mg > 2 for bowel function - Mobilize for bowel function - Hopefully patient will improve with conservative management. If patient fails to improve with conservative management, they may require exploratory surgery during admission - We will follow with you.   FEN - NPO, NGT to LIWS VTE - SCDs, okay for chem ppx from a general surgery standpoint ID - On Rocephin for UTI per [facility]. 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 Also had Splenic vein thrombus. Her acute peripancreatic fluid collections were managed w/ IR perc drainage. Underwent video assisted retroperitoneal debridement in 2015. Etiology of necrotizing pancreatitis was not clear per notes.  Follows with [facility], Dr. Carlis Abbott. Last saw in 2017. At that time they discussed possible cholecystectomy but US showed no evidence of cholelithiasis. Last CT 2015, prior to the one on admission, showed "Residual enhancing pancreatic parenchyma is noted in the head, unchanged. There is a collection in the tail region measuring 3.8 x 0.9 cm, 4.7 x 1.6 cm. This collection tracks down into the anterior pararenal space and into the upper pelvis. No gas within the collection. Thickening of the root of mesentery is noted [...] Splenic vein is thrombosed, with extensive  collaterals". CT on admission w/ atrophy of the body and tail of the pancreas associated with narrowed splenic vein and portosystemic collateral pathways, including large gastric varices, as above. There is also well-defined benign-appearing cystic lesion, 3.8 x 3.3, in the region of the tail of the pancreas, presumably a chronic pancreatic pseudocyst or other benign lesion. Now noted to have Cholelithiasis and biliary sludge in the gallbladder without evidence of acute cholecystitis. Lipase and LFT's were wnl on admission. Discussed with MD. Ideally would like to get her over SBO admission and she can f/u with Surgeon, Dr. Carlis Abbott, at [facility] for pancreatitis and new Cholelithiasis on admission.   - Per [facility] -  UTI DM@ Hypothyroidism  I reviewed nursing notes, hospitalist notes, last 24 h vitals and pain scores, last 48 h intake and output, last 24 h labs and trends, and last 24 h imaging results.   LOS: 2 days    Jillyn Ledger , [facility] Surgery 05/10/2022, 7:58 AM Please see Amion for pager number during day hours 7:00am-4:30pm

## 2022-05-11 ENCOUNTER — Inpatient Hospital Stay (HOSPITAL_COMMUNITY): Payer: Medicare HMO

## 2022-05-11 DIAGNOSIS — K56609 Unspecified intestinal obstruction, unspecified as to partial versus complete obstruction: Secondary | ICD-10-CM | POA: Diagnosis not present

## 2022-05-11 DIAGNOSIS — E119 Type 2 diabetes mellitus without complications: Secondary | ICD-10-CM | POA: Diagnosis not present

## 2022-05-11 DIAGNOSIS — N179 Acute kidney failure, unspecified: Secondary | ICD-10-CM | POA: Diagnosis not present

## 2022-05-11 DIAGNOSIS — R9431 Abnormal electrocardiogram [ECG] [EKG]: Secondary | ICD-10-CM | POA: Diagnosis not present

## 2022-05-11 LAB — BASIC METABOLIC PANEL
Anion gap: 9 (ref 5–15)
BUN: 29 mg/dL — ABNORMAL HIGH (ref 8–23)
CO2: 25 mmol/L (ref 22–32)
Calcium: 8.6 mg/dL — ABNORMAL LOW (ref 8.9–10.3)
Chloride: 110 mmol/L (ref 98–111)
Creatinine, Ser: 1.03 mg/dL — ABNORMAL HIGH (ref 0.44–1.00)
GFR, Estimated: 56 mL/min — ABNORMAL LOW (ref >60)
Glucose, Bld: 118 mg/dL — ABNORMAL HIGH (ref 70–99)
Potassium: 3.6 mmol/L (ref 3.5–5.1)
Sodium: 144 mmol/L (ref 135–145)

## 2022-05-11 LAB — URINE CULTURE: Culture: >=100000 — AB

## 2022-05-11 LAB — PHOSPHORUS: Phosphorus: 2.9 mg/dL (ref 2.5–4.6)

## 2022-05-11 LAB — GLUCOSE, CAPILLARY
Glucose-Capillary: 144 mg/dL — ABNORMAL HIGH (ref 70–99)
Glucose-Capillary: 148 mg/dL — ABNORMAL HIGH (ref 70–99)
Glucose-Capillary: 264 mg/dL — ABNORMAL HIGH (ref 70–99)
Glucose-Capillary: 65 mg/dL — ABNORMAL LOW (ref 70–99)
Glucose-Capillary: 99 mg/dL (ref 70–99)

## 2022-05-11 LAB — MAGNESIUM: Magnesium: 2.5 mg/dL — ABNORMAL HIGH (ref 1.7–2.4)

## 2022-05-11 NOTE — Progress Notes (Deleted)
Physician Discharge Summary  Sara Daniels ZOX:[id number] DOB: [birth year] DOA: 05/08/2022  PCP: Lewis Moccasin, MD  Admit date: 05/08/2022 Discharge date: 05/11/2022  Admitted From: Home  Discharge disposition: Home   Recommendations for Outpatient Follow-Up:   Follow up with your primary care provider in one week.  Check CBC, BMP, magnesium in the next visit Follow-up with Dr. Chestine Spore general surgery as outpatient at [facility] for your history of pancreatitis and cholelithiasis.  Discharge Diagnosis:   Principal Problem:   SBO (small bowel obstruction) (HCC) Active Problems:   Controlled type 2 diabetes mellitus without complication (HCC)   AKI (acute kidney injury) (HCC)   UTI (urinary tract infection)   Abnormal ECG   Discharge Condition: Improved.  Diet recommendation: Low sodium, heart healthy.  Carbohydrate-modified.    Wound care: None.  Code status: Full.   History of Present Illness:   Patient is a 77 years old female with past medical history of diabetes mellitus type 2, hypertension, hypothyroidism history of necrotic pancreatitis s/p pancreatic debridement, presented to the hospital with nausea vomiting and abdominal pain with decreased urination and suprapubic pain, fatigue and chills.  In ED, vitals were stable.  Patient did not have leukocytosis.  UA showed than 50 white cells.  Creatinine was elevated at 1.5.  Lipase was 13.  LFTs were within normal range.  CT scan of the abdomen pelvis done on 05/08/2022 showed findings compatible with severe small bowel obstruction.  Atrophy of the body and tail of pancreas was noted.  Patient was then considered for admission to hospital for further evaluation and treatment.   Hospital Course:   Following conditions were addressed during hospitalization as listed below,  Small bowel obstruction. Seen by general surgery during hospitalization.  Patient was treated conservatively with IV fluids and p.o.  analgesia NG tube initially and improved with conservative treatment.  At this time patient has been advanced on oral diet and has tolerated well.  Small bowel obstruction has resolved.   Borderline hypokalemia/hypomagnesemia Improved after replacement.    Acute kidney injury Improved.  Latest creatinine of 1.0.   Klebsiella UTI Patient received IV Rocephin and has completed the course.   Diabetes mellitus type II.  Insulin-dependent.  Latest hemoglobin A1c of 6.6.  Resume insulin regimen from home.    Hypothyroidism Continue Synthroid on discharge.   Asymptomatic gallstone Lipase and LFTs within normal range.  Disposition.  At this time, patient is stable for disposition home with outpatient PCP follow-up.  Medical Consultants:   General surgery  Procedures:    NG tube placement and removal Subjective:   Today, patient was seen and examined at bedside.  Denies any nausea vomiting fever chills or rigor.  Denies any abdominal pain.  Has had bowel movements.  Discharge Exam:   Vitals:   05/10/22 1942 05/11/22 0415  BP: (!) 125/51 (!) 124/53  Pulse: 61 (!) 55  Resp: 17 17  Temp: 97.8 F (36.6 C) (!) 97.5 F (36.4 C)  SpO2: 94% 95%   Vitals:   05/10/22 0411 05/10/22 1150 05/10/22 1942 05/11/22 0415  BP: (!) 149/58 (!) 141/55 (!) 125/51 (!) 124/53  Pulse: 72 71 61 (!) 55  Resp: 18 16 17 17   Temp: 98 F (36.7 C) 99.1 F (37.3 C) 97.8 F (36.6 C) (!) 97.5 F (36.4 C)  TempSrc: Oral Oral Oral Oral  SpO2: 95% 94% 94% 95%  Weight:      Height:       General: Alert  awake, not in obvious distress, elderly female, HENT: pupils equally reacting to light,  No scleral pallor or icterus noted. Oral mucosa is moist.  Chest:  Clear breath sounds.  Diminished breath sounds bilaterally. No crackles or wheezes.  CVS: S1 &S2 heard.  Murmur noted..  Regular rate and rhythm. Abdomen: Soft, nontender, nondistended.  Bowel sounds are heard.   Extremities: No cyanosis, clubbing  or edema.  Peripheral pulses are palpable. Psych: Alert, awake and oriented, normal mood CNS:  No cranial nerve deficits.  Power equal in all extremities.   Skin: Warm and dry.  No rashes noted.  The results of significant diagnostics from this hospitalization (including imaging, microbiology, ancillary and laboratory) are listed below for reference.     Diagnostic Studies:   DG Abd Portable 1V-Small Bowel Obstruction Protocol-initial, 8 hr delay  Result Date: 05/09/2022 CLINICAL DATA:  77 year old female with history of small-bowel obstruction. EXAM: PORTABLE ABDOMEN - 1 VIEW COMPARISON:  Abdominal radiograph 05/09/2022. FINDINGS: Nasogastric tube again noted, with side port just distal to the gastroesophageal junction and tip in the distal body of the stomach. Multiple dilated loops of small bowel are again noted in the central abdomen, largest of which is slightly to the right of midline measuring up to 5.6 cm in diameter. There is a small amount of gas and stool noted throughout the colon. No definite pneumoperitoneum confidently identified on these supine images. Iodinated contrast material noted in the lumen of the urinary bladder. IMPRESSION: 1. Support apparatus, as above. 2. Persistently abnormal bowel-gas pattern with imaging findings concerning for persistent small bowel obstruction, likely partial. Electronically Signed   By: Trudie Reed M.D.   On: 05/09/2022 17:08   DG Abd 1 View  Result Date: 05/09/2022 CLINICAL DATA:  Small bowel obstruction EXAM: ABDOMEN - 1 VIEW COMPARISON:  CT 05/08/2022, plain film 05/08/2022 FINDINGS: NG tube in stomach. The side-port has migrated several cm above the diaphragm. Consider advancing NG tube approximately 5 cm such that the side port below the GE junction. Dilated loops of small bowel again demonstrated primarily in the RIGHT abdomen. Loops measure up to 4.4 cm. There is gas in the rectum. No intraperitoneal free air identified. IMPRESSION: 1.  Recommend advancing NG tube 5 cm as  above. 2. Persistent dilated loops of small bowel primarily in the RIGHT abdomen. No interval improvement from CT 1 day prior. 3. Small amount gas the rectum. These results will be called to the ordering clinician or representative by the Radiologist Assistant, and communication documented in the PACS or [employer]. Electronically Signed   By: Genevive Bi M.D.   On: 05/09/2022 12:50   DG Chest Portable 1 View  Result Date: 05/08/2022 CLINICAL DATA:  NG placement EXAM: PORTABLE CHEST - 1 VIEW COMPARISON:  None Available. FINDINGS: Cardiac silhouette is unremarkable. No pneumothorax or pleural effusion. The lungs are clear. Aorta is calcified. The visualized skeletal structures are unremarkable. NG tube tip is below the diaphragm in the region of the stomach. IMPRESSION: No acute cardiopulmonary process.  NGT appropriately positioned. Electronically Signed   By: Layla Maw M.D.   On: 05/08/2022 20:02   CT ABDOMEN PELVIS W CONTRAST  Addendum Date: 05/08/2022   ADDENDUM REPORT: 05/08/2022 17:58 ADDENDUM: As mentioned in the body of the report, there is a small amount of gas non dependently in the lumen of the urinary bladder. This may be iatrogenic in the setting of recent bladder catheterization. In the absence of recent history of catheterization, correlation with  urinalysis would be recommended to exclude the possibility of urinary tract infection with gas-forming organisms. Electronically Signed   By: Trudie Reed M.D.   On: 05/08/2022 17:58   Result Date: 05/08/2022 CLINICAL DATA:  77 year old female with abdominal pain, nausea and vomiting. Concern for potential small bowel obstruction. EXAM: CT ABDOMEN AND PELVIS WITH CONTRAST TECHNIQUE: Multidetector CT imaging of the abdomen and pelvis was performed using the standard protocol following bolus administration of intravenous contrast. RADIATION DOSE REDUCTION: This exam was performed according to  the departmental dose-optimization program which includes automated exposure control, adjustment of the mA and/or kV according to patient size and/or use of iterative reconstruction technique. CONTRAST:  65mL OMNIPAQUE IOHEXOL 300 MG/ML  SOLN COMPARISON:  No priors. FINDINGS: Lower chest: Widespread scarring in the lung bases bilaterally. Atherosclerotic calcifications in the descending thoracic aorta as well as the left anterior descending coronary artery. Hepatobiliary: Diffuse low attenuation throughout the hepatic parenchyma, indicative of a background of hepatic steatosis. No discrete cystic or solid hepatic lesions. No intra or extrahepatic biliary ductal dilatation. Multiple calcified gallstones lying dependently in the gallbladder. There is also a large volume of intermediate attenuation fluid in the gallbladder, presumably biliary sludge. Gallbladder is moderately distended. Gallbladder wall thickness appears normal. No pericholecystic fluid or surrounding inflammatory changes to indicate an acute cholecystitis at this time. Pancreas: Atrophy throughout the body and tail of the pancreas. In the region of the pancreatic tail there is a well-defined 3.8 x 3.3 cm low-attenuation lesion, most likely to represent a pancreatic pseudocyst. No solid pancreatic mass. No pancreatic ductal dilatation. No pancreatic or peripancreatic fluid collections or inflammatory changes. Spleen: Unremarkable. Adrenals/Urinary Tract: Right kidney and bilateral adrenal glands are normal in appearance. Mild atrophy of the left kidney where there is also multifocal cortical thinning, likely to reflect chronic post infectious or inflammatory scarring. No hydroureteronephrosis. Urinary bladder is nearly completely decompressed. Small amount of gas non dependently in the urinary bladder is noted. Stomach/Bowel: Numerous varices are noted in the proximal aspect of the stomach. Stomach is otherwise grossly unremarkable in appearance. There  are multiple dilated loops of mid small bowel, which appear to represent distal jejunum and proximal ileum. Distal small bowel is completely decompressed, as is the colon. Small bowel loops measure up to 5.6 cm in diameter, with multiple air-fluid levels, and some bowel loops demonstrating internal feculent contents, suggesting bacterial overgrowth. An exact transition point is not confidently identified on today's CT examination. Numerous colonic diverticula are noted, particularly in the sigmoid colon, without focal surrounding inflammatory changes to indicate an acute diverticulitis at this time. The appendix is not confidently identified and may be surgically absent. Regardless, there are no inflammatory changes noted adjacent to the cecum to suggest the presence of an acute appendicitis at this time. Vascular/Lymphatic: Atherosclerotic calcifications are noted in the abdominal aorta and pelvic vasculature, without evidence of aneurysm or dissection. Splenic vein is patent, but diminutive in size with numerous portosystemic collateral pathways, including large gastric varices. No lymphadenopathy noted in the abdomen or pelvis. Reproductive: Uterus and ovaries are unremarkable in appearance. Other: No significant volume of ascites.  No pneumoperitoneum. Musculoskeletal: There are no aggressive appearing lytic or blastic lesions noted in the visualized portions of the skeleton. IMPRESSION: 1. Findings are compatible with severe small-bowel obstruction, as detailed above. An exact transition point is not identified, however, the portions of the small bowel that appear dilated are the distal jejunum and proximal ileum. Surgical consultation is recommended. 2. Atrophy of the  body and tail of the pancreas associated with narrowed splenic vein and portosystemic collateral pathways, including large gastric varices, as above. This may relate to prior episodes of pancreatitis. Associated with this, there is a well-defined  benign-appearing cystic lesion in the region of the tail of the pancreas, presumably a chronic pancreatic pseudocyst or other benign lesion. 3. Cholelithiasis and biliary sludge in the gallbladder without evidence of acute cholecystitis. 4. Colonic diverticulosis without evidence of acute diverticulitis at this time. 5. Aortic atherosclerosis. 6. Additional incidental findings, as above. Electronically Signed: By: Trudie Reed M.D. On: 05/08/2022 17:44     Labs:   Basic Metabolic Panel: Recent Labs  Lab 05/08/22 1601 05/08/22 2220 05/09/22 0645 05/10/22 0452 05/11/22 0606  NA 135  --  137 140 144  K 4.3  --  3.8 3.9 3.6  CL 97*  --  105 100 110  CO2 25  --  21* 27 25  GLUCOSE 191*  --  81 106* 118*  BUN 38*  --  33* 33* 29*  CREATININE 1.51*  --  1.03* 1.21* 1.03*  CALCIUM 10.3  --  8.6* 9.1 8.6*  MG 2.0  --  1.8 2.7* 2.5*  PHOS  --  3.6 3.5 4.1 2.9   GFR Estimated Creatinine Clearance: 38.2 mL/min (A) (by C-G formula based on SCr of 1.03 mg/dL (H)). Liver Function Tests: Recent Labs  Lab 05/08/22 1601 05/09/22 0645  AST 23 27  ALT 25 29  ALKPHOS 67 55  BILITOT 1.2 0.8  PROT 8.0 6.6  ALBUMIN 5.0 3.6   Recent Labs  Lab 05/08/22 1601  LIPASE 13   No results for input(s): "AMMONIA" in the last 168 hours. Coagulation profile No results for input(s): "INR", "PROTIME" in the last 168 hours.  CBC: Recent Labs  Lab 05/08/22 1601 05/09/22 0645 05/10/22 0452  WBC 10.5 6.3 6.2  NEUTROABS  --   --  5.0  HGB 14.6 12.9 12.5  HCT 42.2 38.1 38.2  MCV 90.0 94.1 95.7  PLT 213 167 155   Cardiac Enzymes: No results for input(s): "CKTOTAL", "CKMB", "CKMBINDEX", "TROPONINI" in the last 168 hours. BNP: Invalid input(s): "POCBNP" CBG: Recent Labs  Lab 05/11/22 0056 05/11/22 0158 05/11/22 0412 05/11/22 0722 05/11/22 1135  GLUCAP 65* 148* 144* 99 264*   D-Dimer No results for input(s): "DDIMER" in the last 72 hours. Hgb A1c Recent Labs    05/08/22 2220   HGBA1C 6.6*   Lipid Profile No results for input(s): "CHOL", "HDL", "LDLCALC", "TRIG", "CHOLHDL", "LDLDIRECT" in the last 72 hours. Thyroid function studies Recent Labs    05/08/22 2220  TSH 2.215   Anemia work up No results for input(s): "VITAMINB12", "FOLATE", "FERRITIN", "TIBC", "IRON", "RETICCTPCT" in the last 72 hours. Microbiology Recent Results (from the past 240 hour(s))  Urine Culture (for pregnant, neutropenic or urologic patients or patients with an indwelling urinary catheter)     Status: Abnormal   Collection Time: 05/08/22  6:42 PM   Specimen: Urine, Clean Catch  Result Value Ref Range Status   Specimen Description   Final    URINE, CLEAN CATCH Performed at [facility], [address] [place], [place] [address]    Special Requests   Final    NONE Performed at [facility], [address], [place], [place] [address]    Culture >=100,000 COLONIES/mL KLEBSIELLA PNEUMONIAE (A)  Final   Report Status 05/11/2022 FINAL  Final   Organism ID, Bacteria KLEBSIELLA PNEUMONIAE (A)  Final  Susceptibility   Klebsiella pneumoniae - MIC*    AMPICILLIN RESISTANT Resistant     CEFAZOLIN <=4 SENSITIVE Sensitive     CEFEPIME <=0.12 SENSITIVE Sensitive     CEFTRIAXONE <=0.25 SENSITIVE Sensitive     CIPROFLOXACIN <=0.25 SENSITIVE Sensitive     GENTAMICIN <=1 SENSITIVE Sensitive     IMIPENEM <=0.25 SENSITIVE Sensitive     NITROFURANTOIN 32 SENSITIVE Sensitive     TRIMETH/SULFA <=20 SENSITIVE Sensitive     AMPICILLIN/SULBACTAM 4 SENSITIVE Sensitive     PIP/TAZO <=4 SENSITIVE Sensitive     * >=100,000 COLONIES/mL KLEBSIELLA PNEUMONIAE     Discharge Instructions:   Discharge Instructions     Call MD for:  persistant nausea and vomiting   Complete by: As directed    Call MD for:  severe uncontrolled pain   Complete by: As directed    Diet Carb Modified   Complete by: As directed    Discharge instructions   Complete by: As  directed    Follow-up with your primary care provider in 1 week.  Check blood work at that time.  Seek medical attention for worsening symptoms.  Follow-up with Dr. Chestine Spore surgeon at [facility] for history of pancreatitis and new gallstones on admission.   Increase activity slowly   Complete by: As directed       Allergies as of 05/11/2022   No Known Allergies      Medication List     STOP taking these medications    Na Sulfate-K Sulfate-Mg Sulf 17.5-3.13-1.6 GM/177ML Soln       TAKE these medications    ACCU-CHEK ACTIVE STRIPS test strip Generic drug: glucose blood by Misc.(Non-Drug; Combo Route) route.   BD Pen Needle Nano U/F 32G X 4 MM Misc Generic drug: Insulin Pen Needle   BIOTIN PO Take 1 Capful by mouth daily.   Cholecalciferol 125 MCG (5000 UT) capsule Take 1 tablet by mouth daily.   GlucoCom Blood Glucose Monitor Devi by Misc.(Non-Drug; Combo Route) route. daily   levothyroxine 75 MCG tablet Commonly known as: SYNTHROID Take 75 mcg by mouth daily before breakfast.   multivitamin tablet Take 1 tablet by mouth daily.   NovoLOG FlexPen 100 UNIT/ML FlexPen Generic drug: insulin aspart Inject into the skin 3 (three) times daily with meals. Sliding scale   olmesartan 20 MG tablet Commonly known as: BENICAR Take 20 mg by mouth daily.   Evaristo Bury FlexTouch 200 UNIT/ML FlexTouch Pen Generic drug: insulin degludec Inject 36 Units into the skin daily.        Follow-up Information     Lewis Moccasin, MD Follow up in 1 week(s).   Specialty: Family Medicine Contact information: [address] [place] [address] [phone]                  Time coordinating discharge: 39 minutes  Signed:  Denesha Brouse  [facility] 05/11/2022, 1:33 PM

## 2022-05-11 NOTE — Progress Notes (Signed)
Subjective: CC: Family in the room. NGT fell out overnight. No n/v. Denies abdominal pain. Passing flatus. 5+ liquid bm's yesterday. Contrast in colon on xray.   Objective: Vital signs in last 24 hours: Temp:  [97.5 F (36.4 C)-99.1 F (37.3 C)] 97.5 F (36.4 C) (02/20 0415) Pulse Rate:  [55-71] 55 (02/20 0415) Resp:  [16-17] 17 (02/20 0415) BP: (124-141)/(51-55) 124/53 (02/20 0415) SpO2:  [94 %-95 %] 95 % (02/20 0415) Last BM Date : 05/10/22  Intake/Output from previous day: No intake/output data recorded. Intake/Output this shift: No intake/output data recorded.  PE: Gen:  Alert, NAD, pleasant Abd: Soft, ND, NT, +BS Lab Results:  Recent Labs    05/09/22 0645 05/10/22 0452  WBC 6.3 6.2  HGB 12.9 12.5  HCT 38.1 38.2  PLT 167 155   BMET Recent Labs    05/10/22 0452 05/11/22 0606  NA 140 144  K 3.9 3.6  CL 100 110  CO2 27 25  GLUCOSE 106* 118*  BUN 33* 29*  CREATININE 1.21* 1.03*  CALCIUM 9.1 8.6*   PT/INR No results for input(s): "LABPROT", "INR" in the last 72 hours. CMP     Component Value Date/Time   NA 144 05/11/2022 0606   K 3.6 05/11/2022 0606   CL 110 05/11/2022 0606   CO2 25 05/11/2022 0606   GLUCOSE 118 (H) 05/11/2022 0606   BUN 29 (H) 05/11/2022 0606   CREATININE 1.03 (H) 05/11/2022 0606   CALCIUM 8.6 (L) 05/11/2022 0606   PROT 6.6 05/09/2022 0645   ALBUMIN 3.6 05/09/2022 0645   AST 27 05/09/2022 0645   ALT 29 05/09/2022 0645   ALKPHOS 55 05/09/2022 0645   BILITOT 0.8 05/09/2022 0645   GFRNONAA 56 (L) 05/11/2022 0606   Lipase     Component Value Date/Time   LIPASE 13 05/08/2022 1601    Studies/Results: DG Abd Portable 1V  Result Date: 05/11/2022 CLINICAL DATA:  Small bowel obstruction EXAM: PORTABLE ABDOMEN - 1 VIEW COMPARISON:  Abdominal radiograph dated 05/10/2022 FINDINGS: Enteric contrast reaches the level of the rectum. Minimally decreased small bowel dilation measuring 5.9 cm, previously 6.2 cm. Previously noted  enteric tube is not seen. IMPRESSION: Enteric contrast reaches the level of the rectum. Slightly improved but persistent small bowel dilation. Electronically Signed   By: Darrin Nipper M.D.   On: 05/11/2022 08:06   DG Abd Portable 1V  Result Date: 05/10/2022 CLINICAL DATA:  [id number] SBO (small bowel obstruction) ([place]) [id number] EXAM: PORTABLE ABDOMEN - 1 VIEW COMPARISON:  Radiograph 05/09/2022 FINDINGS: Nasogastric tube has retracted, side port now overlies the distal esophagus. There is persistent small bowel dilation, most dilated loop measuring up to 6.2 cm, previously up to 6.1 cm cm. IMPRESSION: Persistent small bowel obstruction. Nasogastric tube has retracted, side port now overlies the distal esophagus. Recommend advancement by 6.0 cm. Electronically Signed   By: Maurine Simmering M.D.   On: 05/10/2022 09:19   DG Abd Portable 1V-Small Bowel Obstruction Protocol-initial, 8 hr delay  Result Date: 05/09/2022 CLINICAL DATA:  77 year old female with history of small-bowel obstruction. EXAM: PORTABLE ABDOMEN - 1 VIEW COMPARISON:  Abdominal radiograph 05/09/2022. FINDINGS: Nasogastric tube again noted, with side port just distal to the gastroesophageal junction and tip in the distal body of the stomach. Multiple dilated loops of small bowel are again noted in the central abdomen, largest of which is slightly to the right of midline measuring up to 5.6 cm in diameter. There is a small amount  of gas and stool noted throughout the colon. No definite pneumoperitoneum confidently identified on these supine images. Iodinated contrast material noted in the lumen of the urinary bladder. IMPRESSION: 1. Support apparatus, as above. 2. Persistently abnormal bowel-gas pattern with imaging findings concerning for persistent small bowel obstruction, likely partial. Electronically Signed   By: Vinnie Langton M.D.   On: 05/09/2022 17:08   DG Abd 1 View  Result Date: 05/09/2022 CLINICAL DATA:  Small bowel obstruction EXAM: ABDOMEN -  1 VIEW COMPARISON:  CT 05/08/2022, plain film 05/08/2022 FINDINGS: NG tube in stomach. The side-port has migrated several cm above the diaphragm. Consider advancing NG tube approximately 5 cm such that the side port below the GE junction. Dilated loops of small bowel again demonstrated primarily in the RIGHT abdomen. Loops measure up to 4.4 cm. There is gas in the rectum. No intraperitoneal free air identified. IMPRESSION: 1. Recommend advancing NG tube 5 cm as  above. 2. Persistent dilated loops of small bowel primarily in the RIGHT abdomen. No interval improvement from CT 1 day prior. 3. Small amount gas the rectum. These results will be called to the ordering clinician or representative by the Radiologist Assistant, and communication documented in the PACS or [employer]. Electronically Signed   By: Suzy Bouchard M.D.   On: 05/09/2022 12:50    Anti-infectives: Anti-infectives (From admission, onward)    Start     Dose/Rate Route Frequency Ordered Stop   05/09/22 2000  cefTRIAXone (ROCEPHIN) 1 g in sodium chloride 0.9 % 100 mL IVPB        1 g 200 mL/hr over 30 Minutes Intravenous Every 24 hours 05/08/22 2200     05/08/22 1830  cefTRIAXone (ROCEPHIN) 1 g in sodium chloride 0.9 % 100 mL IVPB        1 g 200 mL/hr over 30 Minutes Intravenous  Once 05/08/22 1819 05/08/22 2044        Assessment/Plan SBO - CT with evidence of SBO without exact transition point identified. She has hx of of appendectomy in 2007 and video assisted retroperitoneal debridement in 2015 - Clinically and radiographically resolving. Place on FLD with ADAT order to soft diet. If tolerates diet, she can be d/c'd from our standpoint.    FEN - FLD, ADAT, IVF per [facility] VTE - SCDs, okay for chem ppx from a general surgery standpoint ID - On Rocephin for UTI per [facility]. 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 Also had Splenic vein thrombus. Her acute peripancreatic fluid collections were managed w/ IR perc drainage. Underwent video assisted retroperitoneal debridement in 2015. Etiology of necrotizing pancreatitis was not clear per notes.  Follows with [facility], Dr. Carlis Abbott. Last saw in 2017. At that time they discussed possible cholecystectomy but US showed no evidence of cholelithiasis. Last CT 2015, prior to the one on admission, showed "Residual enhancing pancreatic parenchyma is noted in the head, unchanged. There is a collection in the tail region measuring 3.8 x 0.9 cm, 4.7 x 1.6 cm. This collection tracks down into the anterior pararenal space and into the upper pelvis. No gas within the collection. 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 There is also well-defined benign-appearing cystic lesion, 3.8 x 3.3, in the region of the tail of the pancreas, presumably a chronic pancreatic pseudocyst or other benign lesion. Now noted to have Cholelithiasis and biliary sludge in the gallbladder without evidence of acute cholecystitis. Lipase and LFT's were wnl on admission. Discussed with MD. Ideally would like to get her over SBO admission and she can f/u with Surgeon, Dr. Carlis Abbott, at [facility] for pancreatitis and new Cholelithiasis on admission. If she has any difficult with f/u with [facility] she can call our office for f/u. Patient states understanding of this.   - Per [facility] -  UTI DM@ Hypothyroidism  I reviewed nursing notes, hospitalist notes, last 24 h vitals and pain scores, last 48 h intake and output, last 24 h labs and trends, and last 24 h imaging results.   LOS: 3 days     Jillyn Ledger , [facility] Surgery 05/11/2022, 8:46 AM Please see Amion for pager number during day hours 7:00am-4:30pm

## 2022-05-12 NOTE — Discharge Summary (Signed)
Physician Discharge Summary  Sara Daniels [id number] DOB: [birth year] DOA: 05/08/2022  PCP: Fanny Bien, MD  Admit date: 05/08/2022 Discharge date: 05/12/2022  Admitted From: Home  Discharge disposition: Home   Recommendations for Outpatient Follow-Up:   Follow up with your primary care provider in one week.  Check CBC, BMP, magnesium in the next visit Follow-up with Dr. Carlis Abbott general surgery as outpatient at [place] for your history of pancreatitis and cholelithiasis.  Discharge Diagnosis:   Principal Problem:   SBO (small bowel obstruction) (HCC) Active Problems:   Controlled type 2 diabetes mellitus without complication (HCC)   AKI (acute kidney injury) ([place])   UTI (urinary tract infection)   Abnormal ECG   Discharge Condition: Improved.  Diet recommendation: Low sodium, heart healthy.  Carbohydrate-modified.    Wound care: None.  Code status: Full.   History of Present Illness:   Patient is a 77 years old female with past medical history of diabetes mellitus type 2, hypertension, hypothyroidism history of necrotic pancreatitis s/p pancreatic debridement, presented to the hospital with nausea vomiting and abdominal pain with decreased urination and suprapubic pain, fatigue and chills.  In ED, vitals were stable.  Patient did not have leukocytosis.  UA showed than 50 white cells.  Creatinine was elevated at 1.5.  Lipase was 13.  LFTs were within normal range.  CT scan of the abdomen pelvis done on 05/08/2022 showed findings compatible with severe small bowel obstruction.  Atrophy of the body and tail of pancreas was noted.  Patient was then considered for admission to hospital for further evaluation and treatment.   Hospital Course:   Following conditions were addressed during hospitalization as listed below,  Small bowel obstruction. Seen by general surgery during hospitalization.  Patient was treated conservatively with IV fluids and p.o.  analgesia NG tube initially and improved with conservative treatment.  At this time patient has been advanced on oral diet and has tolerated well.  Small bowel obstruction has resolved.   Borderline hypokalemia/hypomagnesemia Improved after replacement.    Acute kidney injury Improved.  Latest creatinine of 1.0.   Klebsiella UTI Patient received IV Rocephin and has completed the course.   Diabetes mellitus type II.  Insulin-dependent.  Latest hemoglobin A1c of 6.6.  Resume insulin regimen from home.    Hypothyroidism Continue Synthroid on discharge.   Asymptomatic gallstone Lipase and LFTs within normal range.  Disposition.  At this time, patient is stable for disposition home with outpatient PCP follow-up.  Medical Consultants:   General surgery  Procedures:    NG tube placement and removal Subjective:   Today, patient was seen and examined at bedside.  Denies any nausea vomiting fever chills or rigor.  Denies any abdominal pain.  Has had bowel movements.  Discharge Exam:   Vitals:   05/11/22 0415 05/11/22 1359  BP: (!) 124/53 (!) 136/54  Pulse: (!) 55 68  Resp: 17 19  Temp: (!) 97.5 F (36.4 C) 98.1 F (36.7 C)  SpO2: 95% 96%   Vitals:   05/10/22 1150 05/10/22 1942 05/11/22 0415 05/11/22 1359  BP: (!) 141/55 (!) 125/51 (!) 124/53 (!) 136/54  Pulse: 71 61 (!) 55 68  Resp: 16 17 17 19  $ Temp: 99.1 F (37.3 C) 97.8 F (36.6 C) (!) 97.5 F (36.4 C) 98.1 F (36.7 C)  TempSrc: Oral Oral Oral Oral  SpO2: 94% 94% 95% 96%  Weight:      Height:       General: Alert  awake, not in obvious distress, elderly female, HENT: pupils equally reacting to light,  No scleral pallor or icterus noted. Oral mucosa is moist.  Chest:  Clear breath sounds.  Diminished breath sounds bilaterally. No crackles or wheezes.  CVS: S1 &S2 heard.  Murmur noted..  Regular rate and rhythm. Abdomen: Soft, nontender, nondistended.  Bowel sounds are heard.   Extremities: No cyanosis, clubbing  or edema.  Peripheral pulses are palpable. Psych: Alert, awake and oriented, normal mood CNS:  No cranial nerve deficits.  Power equal in all extremities.   Skin: Warm and dry.  No rashes noted.  The results of significant diagnostics from this hospitalization (including imaging, microbiology, ancillary and laboratory) are listed below for reference.     Diagnostic Studies:   DG Abd Portable 1V-Small Bowel Obstruction Protocol-initial, 8 hr delay  Result Date: 05/09/2022 CLINICAL DATA:  77 year old female with history of small-bowel obstruction. EXAM: PORTABLE ABDOMEN - 1 VIEW COMPARISON:  Abdominal radiograph 05/09/2022. FINDINGS: Nasogastric tube again noted, with side port just distal to the gastroesophageal junction and tip in the distal body of the stomach. Multiple dilated loops of small bowel are again noted in the central abdomen, largest of which is slightly to the right of midline measuring up to 5.6 cm in diameter. There is a small amount of gas and stool noted throughout the colon. No definite pneumoperitoneum confidently identified on these supine images. Iodinated contrast material noted in the lumen of the urinary bladder. IMPRESSION: 1. Support apparatus, as above. 2. Persistently abnormal bowel-gas pattern with imaging findings concerning for persistent small bowel obstruction, likely partial. Electronically Signed   By: Vinnie Langton M.D.   On: 05/09/2022 17:08   DG Abd 1 View  Result Date: 05/09/2022 CLINICAL DATA:  Small bowel obstruction EXAM: ABDOMEN - 1 VIEW COMPARISON:  CT 05/08/2022, plain film 05/08/2022 FINDINGS: NG tube in stomach. The side-port has migrated several cm above the diaphragm. Consider advancing NG tube approximately 5 cm such that the side port below the GE junction. Dilated loops of small bowel again demonstrated primarily in the RIGHT abdomen. Loops measure up to 4.4 cm. There is gas in the rectum. No intraperitoneal free air identified. IMPRESSION: 1.  Recommend advancing NG tube 5 cm as  above. 2. Persistent dilated loops of small bowel primarily in the RIGHT abdomen. No interval improvement from CT 1 day prior. 3. Small amount gas the rectum. These results will be called to the ordering clinician or representative by the Radiologist Assistant, and communication documented in the PACS or [employer]. Electronically Signed   By: Suzy Bouchard M.D.   On: 05/09/2022 12:50   DG Chest Portable 1 View  Result Date: 05/08/2022 CLINICAL DATA:  NG placement EXAM: PORTABLE CHEST - 1 VIEW COMPARISON:  None Available. FINDINGS: Cardiac silhouette is unremarkable. No pneumothorax or pleural effusion. The lungs are clear. Aorta is calcified. The visualized skeletal structures are unremarkable. NG tube tip is below the diaphragm in the region of the stomach. IMPRESSION: No acute cardiopulmonary process.  NGT appropriately positioned. Electronically Signed   By: Sammie Bench M.D.   On: 05/08/2022 20:02   CT ABDOMEN PELVIS W CONTRAST  Addendum Date: 05/08/2022   ADDENDUM REPORT: 05/08/2022 17:58 ADDENDUM: As mentioned in the body of the report, there is a small amount of gas non dependently in the lumen of the urinary bladder. This may be iatrogenic in the setting of recent bladder catheterization. In the absence of recent history of catheterization, correlation with  urinalysis would be recommended to exclude the possibility of urinary tract infection with gas-forming organisms. Electronically Signed   By: Vinnie Langton M.D.   On: 05/08/2022 17:58   Result Date: 05/08/2022 CLINICAL DATA:  77 year old female with abdominal pain, nausea and vomiting. Concern for potential small bowel obstruction. EXAM: CT ABDOMEN AND PELVIS WITH CONTRAST TECHNIQUE: Multidetector CT imaging of the abdomen and pelvis was performed using the standard protocol following bolus administration of intravenous contrast. RADIATION DOSE REDUCTION: This exam was performed according to  the departmental dose-optimization program which includes automated exposure control, adjustment of the mA and/or kV according to patient size and/or use of iterative reconstruction technique. CONTRAST:  52m OMNIPAQUE IOHEXOL 300 MG/ML  SOLN COMPARISON:  No priors. FINDINGS: Lower chest: Widespread scarring in the lung bases bilaterally. Atherosclerotic calcifications in the descending thoracic aorta as well as the left anterior descending coronary artery. Hepatobiliary: Diffuse low attenuation throughout the hepatic parenchyma, indicative of a background of hepatic steatosis. No discrete cystic or solid hepatic lesions. No intra or extrahepatic biliary ductal dilatation. Multiple calcified gallstones lying dependently in the gallbladder. There is also a large volume of intermediate attenuation fluid in the gallbladder, presumably biliary sludge. Gallbladder is moderately distended. Gallbladder wall thickness appears normal. No pericholecystic fluid or surrounding inflammatory changes to indicate an acute cholecystitis at this time. Pancreas: Atrophy throughout the body and tail of the pancreas. In the region of the pancreatic tail there is a well-defined 3.8 x 3.3 cm low-attenuation lesion, most likely to represent a pancreatic pseudocyst. No solid pancreatic mass. No pancreatic ductal dilatation. No pancreatic or peripancreatic fluid collections or inflammatory changes. Spleen: Unremarkable. Adrenals/Urinary Tract: Right kidney and bilateral adrenal glands are normal in appearance. Mild atrophy of the left kidney where there is also multifocal cortical thinning, likely to reflect chronic post infectious or inflammatory scarring. No hydroureteronephrosis. Urinary bladder is nearly completely decompressed. Small amount of gas non dependently in the urinary bladder is noted. Stomach/Bowel: Numerous varices are noted in the proximal aspect of the stomach. Stomach is otherwise grossly unremarkable in appearance. There  are multiple dilated loops of mid small bowel, which appear to represent distal jejunum and proximal ileum. Distal small bowel is completely decompressed, as is the colon. Small bowel loops measure up to 5.6 cm in diameter, with multiple air-fluid levels, and some bowel loops demonstrating internal feculent contents, suggesting bacterial overgrowth. An exact transition point is not confidently identified on today's CT examination. Numerous colonic diverticula are noted, particularly in the sigmoid colon, without focal surrounding inflammatory changes to indicate an acute diverticulitis at this time. The appendix is not confidently identified and may be surgically absent. Regardless, there are no inflammatory changes noted adjacent to the cecum to suggest the presence of an acute appendicitis at this time. Vascular/Lymphatic: Atherosclerotic calcifications are noted in the abdominal aorta and pelvic vasculature, without evidence of aneurysm or dissection. Splenic vein is patent, but diminutive in size with numerous portosystemic collateral pathways, including large gastric varices. No lymphadenopathy noted in the abdomen or pelvis. Reproductive: Uterus and ovaries are unremarkable in appearance. Other: No significant volume of ascites.  No pneumoperitoneum. Musculoskeletal: There are no aggressive appearing lytic or blastic lesions noted in the visualized portions of the skeleton. IMPRESSION: 1. Findings are compatible with severe small-bowel obstruction, as detailed above. An exact transition point is not identified, however, the portions of the small bowel that appear dilated are the distal jejunum and proximal ileum. Surgical consultation is recommended. 2. Atrophy of the  body and tail of the pancreas associated with narrowed splenic vein and portosystemic collateral pathways, including large gastric varices, as above. This may relate to prior episodes of pancreatitis. Associated with this, there is a well-defined  benign-appearing cystic lesion in the region of the tail of the pancreas, presumably a chronic pancreatic pseudocyst or other benign lesion. 3. Cholelithiasis and biliary sludge in the gallbladder without evidence of acute cholecystitis. 4. Colonic diverticulosis without evidence of acute diverticulitis at this time. 5. Aortic atherosclerosis. 6. Additional incidental findings, as above. Electronically Signed: By: Vinnie Langton M.D. On: 05/08/2022 17:44     Labs:   Basic Metabolic Panel: Recent Labs  Lab 05/08/22 1601 05/08/22 2220 05/09/22 0645 05/10/22 0452 05/11/22 0606  NA 135  --  137 140 144  K 4.3  --  3.8 3.9 3.6  CL 97*  --  105 100 110  CO2 25  --  21* 27 25  GLUCOSE 191*  --  81 106* 118*  BUN 38*  --  33* 33* 29*  CREATININE 1.51*  --  1.03* 1.21* 1.03*  CALCIUM 10.3  --  8.6* 9.1 8.6*  MG 2.0  --  1.8 2.7* 2.5*  PHOS  --  3.6 3.5 4.1 2.9    GFR Estimated Creatinine Clearance: 38.2 mL/min (A) (by C-G formula based on SCr of 1.03 mg/dL (H)). Liver Function Tests: Recent Labs  Lab 05/08/22 1601 05/09/22 0645  AST 23 27  ALT 25 29  ALKPHOS 67 55  BILITOT 1.2 0.8  PROT 8.0 6.6  ALBUMIN 5.0 3.6    Recent Labs  Lab 05/08/22 1601  LIPASE 13    No results for input(s): "AMMONIA" in the last 168 hours. Coagulation profile No results for input(s): "INR", "PROTIME" in the last 168 hours.  CBC: Recent Labs  Lab 05/08/22 1601 05/09/22 0645 05/10/22 0452  WBC 10.5 6.3 6.2  NEUTROABS  --   --  5.0  HGB 14.6 12.9 12.5  HCT 42.2 38.1 38.2  MCV 90.0 94.1 95.7  PLT 213 167 155    Cardiac Enzymes: No results for input(s): "CKTOTAL", "CKMB", "CKMBINDEX", "TROPONINI" in the last 168 hours. BNP: Invalid input(s): "POCBNP" CBG: Recent Labs  Lab 05/11/22 0056 05/11/22 0158 05/11/22 0412 05/11/22 0722 05/11/22 1135  GLUCAP 65* 148* 144* 99 264*    D-Dimer No results for input(s): "DDIMER" in the last 72 hours. Hgb A1c No results for input(s):  "HGBA1C" in the last 72 hours.  Lipid Profile No results for input(s): "CHOL", "HDL", "LDLCALC", "TRIG", "CHOLHDL", "LDLDIRECT" in the last 72 hours. Thyroid function studies No results for input(s): "TSH", "T4TOTAL", "T3FREE", "THYROIDAB" in the last 72 hours.  Invalid input(s): "FREET3"  Anemia work up No results for input(s): "VITAMINB12", "FOLATE", "FERRITIN", "TIBC", "IRON", "RETICCTPCT" in the last 72 hours. Microbiology Recent Results (from the past 240 hour(s))  Urine Culture (for pregnant, neutropenic or urologic patients or patients with an indwelling urinary catheter)     Status: Abnormal   Collection Time: 05/08/22  6:42 PM   Specimen: Urine, Clean Catch  Result Value Ref Range Status   Specimen Description   Final    URINE, CLEAN CATCH Performed at [facility], [address], [place], [place] [address]    Special Requests   Final    NONE Performed at [facility], [address] [place], [place] [address]    Culture >=100,000 COLONIES/mL KLEBSIELLA PNEUMONIAE (A)  Final   Report Status 05/11/2022 FINAL  Final  Organism ID, Bacteria KLEBSIELLA PNEUMONIAE (A)  Final      Susceptibility   Klebsiella pneumoniae - MIC*    AMPICILLIN RESISTANT Resistant     CEFAZOLIN <=4 SENSITIVE Sensitive     CEFEPIME <=0.12 SENSITIVE Sensitive     CEFTRIAXONE <=0.25 SENSITIVE Sensitive     CIPROFLOXACIN <=0.25 SENSITIVE Sensitive     GENTAMICIN <=1 SENSITIVE Sensitive     IMIPENEM <=0.25 SENSITIVE Sensitive     NITROFURANTOIN 32 SENSITIVE Sensitive     TRIMETH/SULFA <=20 SENSITIVE Sensitive     AMPICILLIN/SULBACTAM 4 SENSITIVE Sensitive     PIP/TAZO <=4 SENSITIVE Sensitive     * >=100,000 COLONIES/mL KLEBSIELLA PNEUMONIAE     Discharge Instructions:   Discharge Instructions     Call MD for:  persistant nausea and vomiting   Complete by: As directed    Call MD for:  severe uncontrolled pain   Complete by: As directed    Diet  Carb Modified   Complete by: As directed    Discharge instructions   Complete by: As directed    Follow-up with your primary care provider in 1 week.  Check blood work at that time.  Seek medical attention for worsening symptoms.  Follow-up with Dr. Carlis Abbott surgeon [facility] for history of pancreatitis and new gallstones on admission.   Increase activity slowly   Complete by: As directed       Allergies as of 05/11/2022   No Known Allergies      Medication List     STOP taking these medications    Na Sulfate-K Sulfate-Mg Sulf 17.5-3.13-1.6 GM/177ML Soln       TAKE these medications    ACCU-CHEK ACTIVE STRIPS test strip Generic drug: glucose blood by Misc.(Non-Drug; Combo Route) route.   BD Pen Needle Nano U/F 32G X 4 MM Misc Generic drug: Insulin Pen Needle   BIOTIN PO Take 1 Capful by mouth daily.   Cholecalciferol 125 MCG (5000 UT) capsule Take 1 tablet by mouth daily.   GlucoCom Blood Glucose Monitor Devi by Misc.(Non-Drug; Combo Route) route. daily   levothyroxine 75 MCG tablet Commonly known as: SYNTHROID Take 75 mcg by mouth daily before breakfast.   multivitamin tablet Take 1 tablet by mouth daily.   NovoLOG FlexPen 100 UNIT/ML FlexPen Generic drug: insulin aspart Inject into the skin 3 (three) times daily with meals. Sliding scale   olmesartan 20 MG tablet Commonly known as: BENICAR Take 20 mg by mouth daily.   Tyler Aas FlexTouch 200 UNIT/ML FlexTouch Pen Generic drug: insulin degludec Inject 36 Units into the skin daily.        Follow-up Information     Fanny Bien, MD Follow up in 1 week(s).   Specialty: Family Medicine Contact information: [address] [place] [address] [phone]                  Time coordinating discharge: 39 minutes  Signed:  Koki Buxton  [facility] 05/12/2022, 4:38 PM

## 2022-05-13 DIAGNOSIS — K5900 Constipation, unspecified: Secondary | ICD-10-CM | POA: Diagnosis not present

## 2022-05-13 DIAGNOSIS — K56609 Unspecified intestinal obstruction, unspecified as to partial versus complete obstruction: Secondary | ICD-10-CM | POA: Diagnosis not present

## 2022-05-13 DIAGNOSIS — K862 Cyst of pancreas: Secondary | ICD-10-CM | POA: Diagnosis not present

## 2022-05-13 DIAGNOSIS — K828 Other specified diseases of gallbladder: Secondary | ICD-10-CM | POA: Diagnosis not present

## 2022-05-18 DIAGNOSIS — Z0000 Encounter for general adult medical examination without abnormal findings: Secondary | ICD-10-CM | POA: Diagnosis not present

## 2022-05-18 DIAGNOSIS — I10 Essential (primary) hypertension: Secondary | ICD-10-CM | POA: Diagnosis not present

## 2022-05-18 DIAGNOSIS — K5900 Constipation, unspecified: Secondary | ICD-10-CM | POA: Diagnosis not present

## 2022-05-18 DIAGNOSIS — K56609 Unspecified intestinal obstruction, unspecified as to partial versus complete obstruction: Secondary | ICD-10-CM | POA: Diagnosis not present

## 2022-05-18 DIAGNOSIS — Z1331 Encounter for screening for depression: Secondary | ICD-10-CM | POA: Diagnosis not present

## 2022-05-18 DIAGNOSIS — Z1339 Encounter for screening examination for other mental health and behavioral disorders: Secondary | ICD-10-CM | POA: Diagnosis not present

## 2022-06-02 DIAGNOSIS — E119 Type 2 diabetes mellitus without complications: Secondary | ICD-10-CM | POA: Diagnosis not present

## 2022-06-03 DIAGNOSIS — E1165 Type 2 diabetes mellitus with hyperglycemia: Secondary | ICD-10-CM | POA: Diagnosis not present

## 2022-06-03 DIAGNOSIS — E1143 Type 2 diabetes mellitus with diabetic autonomic (poly)neuropathy: Secondary | ICD-10-CM | POA: Diagnosis not present

## 2022-06-03 DIAGNOSIS — I10 Essential (primary) hypertension: Secondary | ICD-10-CM | POA: Diagnosis not present

## 2022-06-03 DIAGNOSIS — E1159 Type 2 diabetes mellitus with other circulatory complications: Secondary | ICD-10-CM | POA: Diagnosis not present

## 2022-06-03 DIAGNOSIS — I152 Hypertension secondary to endocrine disorders: Secondary | ICD-10-CM | POA: Diagnosis not present

## 2022-06-03 DIAGNOSIS — K56609 Unspecified intestinal obstruction, unspecified as to partial versus complete obstruction: Secondary | ICD-10-CM | POA: Diagnosis not present

## 2022-06-09 DIAGNOSIS — T7840XA Allergy, unspecified, initial encounter: Secondary | ICD-10-CM | POA: Diagnosis not present

## 2022-06-09 DIAGNOSIS — R059 Cough, unspecified: Secondary | ICD-10-CM | POA: Diagnosis not present

## 2022-06-17 DIAGNOSIS — Z7189 Other specified counseling: Secondary | ICD-10-CM | POA: Diagnosis not present

## 2022-06-17 DIAGNOSIS — Z872 Personal history of diseases of the skin and subcutaneous tissue: Secondary | ICD-10-CM | POA: Diagnosis not present

## 2022-06-17 DIAGNOSIS — L821 Other seborrheic keratosis: Secondary | ICD-10-CM | POA: Diagnosis not present

## 2022-06-17 DIAGNOSIS — Z08 Encounter for follow-up examination after completed treatment for malignant neoplasm: Secondary | ICD-10-CM | POA: Diagnosis not present

## 2022-06-17 DIAGNOSIS — Z85828 Personal history of other malignant neoplasm of skin: Secondary | ICD-10-CM | POA: Diagnosis not present

## 2022-06-17 DIAGNOSIS — D225 Melanocytic nevi of trunk: Secondary | ICD-10-CM | POA: Diagnosis not present

## 2022-06-30 DIAGNOSIS — H2513 Age-related nuclear cataract, bilateral: Secondary | ICD-10-CM | POA: Diagnosis not present

## 2022-06-30 DIAGNOSIS — E109 Type 1 diabetes mellitus without complications: Secondary | ICD-10-CM | POA: Diagnosis not present

## 2022-06-30 DIAGNOSIS — E1339 Other specified diabetes mellitus with other diabetic ophthalmic complication: Secondary | ICD-10-CM | POA: Diagnosis not present

## 2022-07-03 DIAGNOSIS — E119 Type 2 diabetes mellitus without complications: Secondary | ICD-10-CM | POA: Diagnosis not present

## 2022-07-20 DIAGNOSIS — H269 Unspecified cataract: Secondary | ICD-10-CM | POA: Diagnosis not present

## 2022-07-20 DIAGNOSIS — J309 Allergic rhinitis, unspecified: Secondary | ICD-10-CM | POA: Diagnosis not present

## 2022-07-20 DIAGNOSIS — H00019 Hordeolum externum unspecified eye, unspecified eyelid: Secondary | ICD-10-CM | POA: Diagnosis not present

## 2022-08-03 DIAGNOSIS — E119 Type 2 diabetes mellitus without complications: Secondary | ICD-10-CM | POA: Diagnosis not present

## 2022-09-03 DIAGNOSIS — E119 Type 2 diabetes mellitus without complications: Secondary | ICD-10-CM | POA: Diagnosis not present

## 2022-09-06 DIAGNOSIS — I10 Essential (primary) hypertension: Secondary | ICD-10-CM | POA: Diagnosis not present

## 2022-09-06 DIAGNOSIS — E663 Overweight: Secondary | ICD-10-CM | POA: Diagnosis not present

## 2022-09-06 DIAGNOSIS — E1165 Type 2 diabetes mellitus with hyperglycemia: Secondary | ICD-10-CM | POA: Diagnosis not present

## 2022-09-06 DIAGNOSIS — E039 Hypothyroidism, unspecified: Secondary | ICD-10-CM | POA: Diagnosis not present

## 2022-09-09 DIAGNOSIS — E039 Hypothyroidism, unspecified: Secondary | ICD-10-CM | POA: Diagnosis not present

## 2022-09-09 DIAGNOSIS — Z789 Other specified health status: Secondary | ICD-10-CM | POA: Diagnosis not present

## 2022-09-09 DIAGNOSIS — G720 Drug-induced myopathy: Secondary | ICD-10-CM | POA: Diagnosis not present

## 2022-09-09 DIAGNOSIS — E782 Mixed hyperlipidemia: Secondary | ICD-10-CM | POA: Diagnosis not present

## 2022-09-09 DIAGNOSIS — E1165 Type 2 diabetes mellitus with hyperglycemia: Secondary | ICD-10-CM | POA: Diagnosis not present

## 2022-09-09 DIAGNOSIS — I10 Essential (primary) hypertension: Secondary | ICD-10-CM | POA: Diagnosis not present

## 2022-10-04 DIAGNOSIS — E119 Type 2 diabetes mellitus without complications: Secondary | ICD-10-CM | POA: Diagnosis not present

## 2022-11-04 DIAGNOSIS — E119 Type 2 diabetes mellitus without complications: Secondary | ICD-10-CM | POA: Diagnosis not present

## 2022-12-05 DIAGNOSIS — E119 Type 2 diabetes mellitus without complications: Secondary | ICD-10-CM | POA: Diagnosis not present

## 2022-12-08 DIAGNOSIS — M8580 Other specified disorders of bone density and structure, unspecified site: Secondary | ICD-10-CM | POA: Diagnosis not present

## 2022-12-08 DIAGNOSIS — E559 Vitamin D deficiency, unspecified: Secondary | ICD-10-CM | POA: Diagnosis not present

## 2022-12-08 DIAGNOSIS — E1165 Type 2 diabetes mellitus with hyperglycemia: Secondary | ICD-10-CM | POA: Diagnosis not present

## 2022-12-08 DIAGNOSIS — R5383 Other fatigue: Secondary | ICD-10-CM | POA: Diagnosis not present

## 2022-12-14 DIAGNOSIS — E1165 Type 2 diabetes mellitus with hyperglycemia: Secondary | ICD-10-CM | POA: Diagnosis not present

## 2022-12-14 DIAGNOSIS — I10 Essential (primary) hypertension: Secondary | ICD-10-CM | POA: Diagnosis not present

## 2022-12-14 DIAGNOSIS — E559 Vitamin D deficiency, unspecified: Secondary | ICD-10-CM | POA: Diagnosis not present

## 2022-12-14 DIAGNOSIS — L659 Nonscarring hair loss, unspecified: Secondary | ICD-10-CM | POA: Diagnosis not present

## 2022-12-14 DIAGNOSIS — E782 Mixed hyperlipidemia: Secondary | ICD-10-CM | POA: Diagnosis not present

## 2022-12-14 DIAGNOSIS — E1169 Type 2 diabetes mellitus with other specified complication: Secondary | ICD-10-CM | POA: Diagnosis not present

## 2022-12-14 DIAGNOSIS — Z23 Encounter for immunization: Secondary | ICD-10-CM | POA: Diagnosis not present

## 2023-01-05 DIAGNOSIS — E119 Type 2 diabetes mellitus without complications: Secondary | ICD-10-CM | POA: Diagnosis not present

## 2023-02-05 DIAGNOSIS — E119 Type 2 diabetes mellitus without complications: Secondary | ICD-10-CM | POA: Diagnosis not present

## 2023-03-08 DIAGNOSIS — E119 Type 2 diabetes mellitus without complications: Secondary | ICD-10-CM | POA: Diagnosis not present

## 2023-03-14 DIAGNOSIS — R5383 Other fatigue: Secondary | ICD-10-CM | POA: Diagnosis not present

## 2023-03-14 DIAGNOSIS — R739 Hyperglycemia, unspecified: Secondary | ICD-10-CM | POA: Diagnosis not present

## 2023-03-22 DIAGNOSIS — I10 Essential (primary) hypertension: Secondary | ICD-10-CM | POA: Diagnosis not present

## 2023-03-22 DIAGNOSIS — E1165 Type 2 diabetes mellitus with hyperglycemia: Secondary | ICD-10-CM | POA: Diagnosis not present

## 2023-03-22 DIAGNOSIS — E039 Hypothyroidism, unspecified: Secondary | ICD-10-CM | POA: Diagnosis not present

## 2023-04-08 DIAGNOSIS — E119 Type 2 diabetes mellitus without complications: Secondary | ICD-10-CM | POA: Diagnosis not present

## 2023-05-05 DIAGNOSIS — Z0000 Encounter for general adult medical examination without abnormal findings: Secondary | ICD-10-CM | POA: Diagnosis not present

## 2023-05-05 DIAGNOSIS — Z13220 Encounter for screening for lipoid disorders: Secondary | ICD-10-CM | POA: Diagnosis not present

## 2023-05-09 DIAGNOSIS — E119 Type 2 diabetes mellitus without complications: Secondary | ICD-10-CM | POA: Diagnosis not present

## 2023-05-18 DIAGNOSIS — Z0000 Encounter for general adult medical examination without abnormal findings: Secondary | ICD-10-CM | POA: Diagnosis not present

## 2023-05-18 DIAGNOSIS — I10 Essential (primary) hypertension: Secondary | ICD-10-CM | POA: Diagnosis not present

## 2023-06-08 DIAGNOSIS — R739 Hyperglycemia, unspecified: Secondary | ICD-10-CM | POA: Diagnosis not present

## 2023-06-09 DIAGNOSIS — E119 Type 2 diabetes mellitus without complications: Secondary | ICD-10-CM | POA: Diagnosis not present

## 2023-06-30 DIAGNOSIS — I10 Essential (primary) hypertension: Secondary | ICD-10-CM | POA: Diagnosis not present

## 2023-06-30 DIAGNOSIS — Z0000 Encounter for general adult medical examination without abnormal findings: Secondary | ICD-10-CM | POA: Diagnosis not present

## 2023-06-30 DIAGNOSIS — Z789 Other specified health status: Secondary | ICD-10-CM | POA: Diagnosis not present

## 2023-06-30 DIAGNOSIS — Z1331 Encounter for screening for depression: Secondary | ICD-10-CM | POA: Diagnosis not present

## 2023-06-30 DIAGNOSIS — E1121 Type 2 diabetes mellitus with diabetic nephropathy: Secondary | ICD-10-CM | POA: Diagnosis not present

## 2023-06-30 DIAGNOSIS — Z1339 Encounter for screening examination for other mental health and behavioral disorders: Secondary | ICD-10-CM | POA: Diagnosis not present

## 2023-06-30 DIAGNOSIS — E1165 Type 2 diabetes mellitus with hyperglycemia: Secondary | ICD-10-CM | POA: Diagnosis not present

## 2023-06-30 DIAGNOSIS — E782 Mixed hyperlipidemia: Secondary | ICD-10-CM | POA: Diagnosis not present

## 2023-07-05 DIAGNOSIS — Z872 Personal history of diseases of the skin and subcutaneous tissue: Secondary | ICD-10-CM | POA: Diagnosis not present

## 2023-07-05 DIAGNOSIS — L820 Inflamed seborrheic keratosis: Secondary | ICD-10-CM | POA: Diagnosis not present

## 2023-07-05 DIAGNOSIS — Z08 Encounter for follow-up examination after completed treatment for malignant neoplasm: Secondary | ICD-10-CM | POA: Diagnosis not present

## 2023-07-05 DIAGNOSIS — Z85828 Personal history of other malignant neoplasm of skin: Secondary | ICD-10-CM | POA: Diagnosis not present

## 2023-07-05 DIAGNOSIS — D485 Neoplasm of uncertain behavior of skin: Secondary | ICD-10-CM | POA: Diagnosis not present

## 2023-07-05 DIAGNOSIS — Z7189 Other specified counseling: Secondary | ICD-10-CM | POA: Diagnosis not present

## 2023-07-05 DIAGNOSIS — D225 Melanocytic nevi of trunk: Secondary | ICD-10-CM | POA: Diagnosis not present

## 2023-07-05 DIAGNOSIS — L821 Other seborrheic keratosis: Secondary | ICD-10-CM | POA: Diagnosis not present

## 2023-07-10 DIAGNOSIS — E119 Type 2 diabetes mellitus without complications: Secondary | ICD-10-CM | POA: Diagnosis not present

## 2023-07-28 DIAGNOSIS — E559 Vitamin D deficiency, unspecified: Secondary | ICD-10-CM | POA: Diagnosis not present

## 2023-07-28 DIAGNOSIS — R5383 Other fatigue: Secondary | ICD-10-CM | POA: Diagnosis not present

## 2023-08-03 DIAGNOSIS — I129 Hypertensive chronic kidney disease with stage 1 through stage 4 chronic kidney disease, or unspecified chronic kidney disease: Secondary | ICD-10-CM | POA: Diagnosis not present

## 2023-08-03 DIAGNOSIS — E559 Vitamin D deficiency, unspecified: Secondary | ICD-10-CM | POA: Diagnosis not present

## 2023-08-03 DIAGNOSIS — E039 Hypothyroidism, unspecified: Secondary | ICD-10-CM | POA: Diagnosis not present

## 2023-08-03 DIAGNOSIS — E1122 Type 2 diabetes mellitus with diabetic chronic kidney disease: Secondary | ICD-10-CM | POA: Diagnosis not present

## 2023-08-03 DIAGNOSIS — E1143 Type 2 diabetes mellitus with diabetic autonomic (poly)neuropathy: Secondary | ICD-10-CM | POA: Diagnosis not present

## 2023-08-03 DIAGNOSIS — E1165 Type 2 diabetes mellitus with hyperglycemia: Secondary | ICD-10-CM | POA: Diagnosis not present

## 2023-08-10 DIAGNOSIS — E119 Type 2 diabetes mellitus without complications: Secondary | ICD-10-CM | POA: Diagnosis not present

## 2023-09-10 DIAGNOSIS — E119 Type 2 diabetes mellitus without complications: Secondary | ICD-10-CM | POA: Diagnosis not present

## 2023-09-22 DIAGNOSIS — R739 Hyperglycemia, unspecified: Secondary | ICD-10-CM | POA: Diagnosis not present

## 2023-09-22 DIAGNOSIS — R5383 Other fatigue: Secondary | ICD-10-CM | POA: Diagnosis not present

## 2023-09-27 DIAGNOSIS — Z79899 Other long term (current) drug therapy: Secondary | ICD-10-CM | POA: Diagnosis not present

## 2023-09-27 DIAGNOSIS — E039 Hypothyroidism, unspecified: Secondary | ICD-10-CM | POA: Diagnosis not present

## 2023-09-27 DIAGNOSIS — E1165 Type 2 diabetes mellitus with hyperglycemia: Secondary | ICD-10-CM | POA: Diagnosis not present

## 2023-09-27 DIAGNOSIS — Z7984 Long term (current) use of oral hypoglycemic drugs: Secondary | ICD-10-CM | POA: Diagnosis not present

## 2023-09-27 DIAGNOSIS — L659 Nonscarring hair loss, unspecified: Secondary | ICD-10-CM | POA: Diagnosis not present

## 2023-09-27 DIAGNOSIS — I10 Essential (primary) hypertension: Secondary | ICD-10-CM | POA: Diagnosis not present

## 2023-10-11 DIAGNOSIS — E119 Type 2 diabetes mellitus without complications: Secondary | ICD-10-CM | POA: Diagnosis not present

## 2023-10-20 DIAGNOSIS — E1165 Type 2 diabetes mellitus with hyperglycemia: Secondary | ICD-10-CM | POA: Diagnosis not present

## 2023-10-20 DIAGNOSIS — E663 Overweight: Secondary | ICD-10-CM | POA: Diagnosis not present

## 2023-10-20 DIAGNOSIS — Z79899 Other long term (current) drug therapy: Secondary | ICD-10-CM | POA: Diagnosis not present

## 2023-10-20 DIAGNOSIS — I10 Essential (primary) hypertension: Secondary | ICD-10-CM | POA: Diagnosis not present

## 2023-12-21 DIAGNOSIS — E039 Hypothyroidism, unspecified: Secondary | ICD-10-CM | POA: Diagnosis not present

## 2023-12-21 DIAGNOSIS — E1165 Type 2 diabetes mellitus with hyperglycemia: Secondary | ICD-10-CM | POA: Diagnosis not present

## 2023-12-21 DIAGNOSIS — I10 Essential (primary) hypertension: Secondary | ICD-10-CM | POA: Diagnosis not present

## 2023-12-29 DIAGNOSIS — E1122 Type 2 diabetes mellitus with diabetic chronic kidney disease: Secondary | ICD-10-CM | POA: Diagnosis not present

## 2023-12-29 DIAGNOSIS — I129 Hypertensive chronic kidney disease with stage 1 through stage 4 chronic kidney disease, or unspecified chronic kidney disease: Secondary | ICD-10-CM | POA: Diagnosis not present

## 2023-12-29 DIAGNOSIS — E1165 Type 2 diabetes mellitus with hyperglycemia: Secondary | ICD-10-CM | POA: Diagnosis not present

## 2023-12-29 DIAGNOSIS — Z79899 Other long term (current) drug therapy: Secondary | ICD-10-CM | POA: Diagnosis not present

## 2023-12-29 DIAGNOSIS — Z7985 Long-term (current) use of injectable non-insulin antidiabetic drugs: Secondary | ICD-10-CM | POA: Diagnosis not present

## 2023-12-29 DIAGNOSIS — Z7984 Long term (current) use of oral hypoglycemic drugs: Secondary | ICD-10-CM | POA: Diagnosis not present

## 2023-12-29 DIAGNOSIS — I10 Essential (primary) hypertension: Secondary | ICD-10-CM | POA: Diagnosis not present

## 2023-12-29 DIAGNOSIS — E039 Hypothyroidism, unspecified: Secondary | ICD-10-CM | POA: Diagnosis not present

## 2024-01-03 DIAGNOSIS — E1165 Type 2 diabetes mellitus with hyperglycemia: Secondary | ICD-10-CM | POA: Diagnosis not present

## 2024-02-23 DIAGNOSIS — Z7984 Long term (current) use of oral hypoglycemic drugs: Secondary | ICD-10-CM | POA: Diagnosis not present

## 2024-02-23 DIAGNOSIS — E1165 Type 2 diabetes mellitus with hyperglycemia: Secondary | ICD-10-CM | POA: Diagnosis not present

## 2024-02-23 DIAGNOSIS — Z79899 Other long term (current) drug therapy: Secondary | ICD-10-CM | POA: Diagnosis not present
